# Patient Record
Sex: Male | Born: 1945 | Race: Black or African American | Hispanic: No | Marital: Married | Smoking: Never smoker
Health system: Southern US, Community
[De-identification: ages and names within clinical notes are randomized; demographics above are authoritative.]

## PROBLEM LIST (undated history)

## (undated) DIAGNOSIS — I2510 Atherosclerotic heart disease of native coronary artery without angina pectoris: Secondary | ICD-10-CM

## (undated) DIAGNOSIS — G4733 Obstructive sleep apnea (adult) (pediatric): Secondary | ICD-10-CM

## (undated) DIAGNOSIS — I43 Cardiomyopathy in diseases classified elsewhere: Secondary | ICD-10-CM

## (undated) DIAGNOSIS — I10 Essential (primary) hypertension: Secondary | ICD-10-CM

## (undated) DIAGNOSIS — I4892 Unspecified atrial flutter: Secondary | ICD-10-CM

## (undated) DIAGNOSIS — I340 Nonrheumatic mitral (valve) insufficiency: Secondary | ICD-10-CM

## (undated) DIAGNOSIS — I4891 Unspecified atrial fibrillation: Secondary | ICD-10-CM

## (undated) DIAGNOSIS — R000 Tachycardia, unspecified: Secondary | ICD-10-CM

## (undated) DIAGNOSIS — E119 Type 2 diabetes mellitus without complications: Secondary | ICD-10-CM

## (undated) DIAGNOSIS — G510 Bell's palsy: Secondary | ICD-10-CM

## (undated) HISTORY — DX: Nonrheumatic mitral (valve) insufficiency: I34.0

## (undated) HISTORY — DX: Unspecified atrial flutter: I48.92

## (undated) HISTORY — DX: Type 2 diabetes mellitus without complications: E11.9

## (undated) HISTORY — DX: Bell's palsy: G51.0

## (undated) HISTORY — DX: Atherosclerotic heart disease of native coronary artery without angina pectoris: I25.10

## (undated) HISTORY — DX: Essential (primary) hypertension: I10

## (undated) HISTORY — DX: Obstructive sleep apnea (adult) (pediatric): G47.33

## (undated) HISTORY — DX: Cardiomyopathy in diseases classified elsewhere: I43

## (undated) HISTORY — DX: Tachycardia, unspecified: R00.0

## (undated) HISTORY — DX: Unspecified atrial fibrillation: I48.91

---

## 2003-04-02 ENCOUNTER — Encounter: Payer: Self-pay | Admitting: *Deleted

## 2003-04-02 ENCOUNTER — Inpatient Hospital Stay (HOSPITAL_COMMUNITY): Admission: AD | Admit: 2003-04-02 | Discharge: 2003-04-11 | Payer: Self-pay | Admitting: Cardiology

## 2003-06-19 ENCOUNTER — Ambulatory Visit (HOSPITAL_COMMUNITY): Admission: RE | Admit: 2003-06-19 | Discharge: 2003-06-20 | Payer: Self-pay | Admitting: Internal Medicine

## 2005-06-28 ENCOUNTER — Ambulatory Visit: Payer: Self-pay | Admitting: Cardiology

## 2005-07-01 ENCOUNTER — Ambulatory Visit: Payer: Self-pay | Admitting: Cardiology

## 2005-07-04 ENCOUNTER — Ambulatory Visit: Payer: Self-pay | Admitting: Cardiology

## 2005-07-08 ENCOUNTER — Ambulatory Visit: Payer: Self-pay | Admitting: Cardiology

## 2005-07-08 ENCOUNTER — Inpatient Hospital Stay (HOSPITAL_BASED_OUTPATIENT_CLINIC_OR_DEPARTMENT_OTHER): Admission: RE | Admit: 2005-07-08 | Discharge: 2005-07-08 | Payer: Self-pay | Admitting: Cardiology

## 2005-07-12 ENCOUNTER — Ambulatory Visit: Payer: Self-pay | Admitting: Cardiology

## 2006-06-29 ENCOUNTER — Encounter: Payer: Self-pay | Admitting: Cardiology

## 2007-07-12 ENCOUNTER — Ambulatory Visit: Payer: Self-pay | Admitting: Cardiology

## 2007-08-27 ENCOUNTER — Ambulatory Visit: Payer: Self-pay | Admitting: Cardiology

## 2007-08-30 ENCOUNTER — Ambulatory Visit: Payer: Self-pay | Admitting: Cardiology

## 2007-09-11 ENCOUNTER — Encounter: Payer: Self-pay | Admitting: Cardiology

## 2007-10-16 ENCOUNTER — Ambulatory Visit: Payer: Self-pay | Admitting: Cardiology

## 2007-10-24 ENCOUNTER — Ambulatory Visit: Payer: Self-pay | Admitting: Cardiology

## 2007-11-12 ENCOUNTER — Encounter: Payer: Self-pay | Admitting: Physician Assistant

## 2007-12-21 ENCOUNTER — Ambulatory Visit: Payer: Self-pay | Admitting: Cardiology

## 2007-12-28 ENCOUNTER — Ambulatory Visit: Payer: Self-pay | Admitting: Cardiology

## 2008-01-04 ENCOUNTER — Ambulatory Visit: Payer: Self-pay | Admitting: Cardiology

## 2008-01-11 ENCOUNTER — Ambulatory Visit: Payer: Self-pay | Admitting: Cardiology

## 2008-01-17 ENCOUNTER — Ambulatory Visit: Payer: Self-pay | Admitting: Cardiology

## 2008-02-01 ENCOUNTER — Ambulatory Visit: Payer: Self-pay | Admitting: Cardiology

## 2008-02-11 ENCOUNTER — Ambulatory Visit: Payer: Self-pay | Admitting: Cardiology

## 2008-02-29 ENCOUNTER — Ambulatory Visit: Payer: Self-pay | Admitting: Cardiology

## 2008-03-25 ENCOUNTER — Ambulatory Visit: Payer: Self-pay | Admitting: Cardiology

## 2008-04-04 ENCOUNTER — Ambulatory Visit: Payer: Self-pay | Admitting: Cardiology

## 2008-04-18 ENCOUNTER — Ambulatory Visit: Payer: Self-pay | Admitting: Cardiology

## 2008-04-23 ENCOUNTER — Encounter: Payer: Self-pay | Admitting: Physician Assistant

## 2008-04-23 ENCOUNTER — Ambulatory Visit: Payer: Self-pay | Admitting: Cardiology

## 2008-05-19 ENCOUNTER — Ambulatory Visit: Payer: Self-pay | Admitting: Cardiology

## 2008-06-02 ENCOUNTER — Ambulatory Visit: Payer: Self-pay | Admitting: Cardiology

## 2008-06-04 ENCOUNTER — Ambulatory Visit: Payer: Self-pay | Admitting: Cardiology

## 2008-06-27 ENCOUNTER — Encounter: Payer: Self-pay | Admitting: Cardiology

## 2008-07-03 ENCOUNTER — Ambulatory Visit: Payer: Self-pay | Admitting: Cardiology

## 2008-07-29 ENCOUNTER — Ambulatory Visit: Payer: Self-pay | Admitting: Cardiology

## 2008-08-04 ENCOUNTER — Ambulatory Visit: Payer: Self-pay | Admitting: Cardiology

## 2008-08-19 ENCOUNTER — Ambulatory Visit: Payer: Self-pay | Admitting: Cardiology

## 2008-09-30 ENCOUNTER — Ambulatory Visit: Payer: Self-pay | Admitting: Cardiology

## 2008-10-10 ENCOUNTER — Ambulatory Visit: Payer: Self-pay | Admitting: Cardiology

## 2008-10-24 ENCOUNTER — Ambulatory Visit: Payer: Self-pay | Admitting: Cardiology

## 2008-11-14 ENCOUNTER — Ambulatory Visit: Payer: Self-pay | Admitting: Cardiology

## 2009-01-27 ENCOUNTER — Ambulatory Visit: Payer: Self-pay | Admitting: Cardiology

## 2009-02-10 ENCOUNTER — Ambulatory Visit: Payer: Self-pay | Admitting: Cardiology

## 2009-03-17 ENCOUNTER — Ambulatory Visit: Payer: Self-pay | Admitting: Cardiology

## 2009-04-07 ENCOUNTER — Encounter: Payer: Self-pay | Admitting: Cardiology

## 2009-04-07 ENCOUNTER — Ambulatory Visit: Payer: Self-pay | Admitting: Cardiology

## 2009-04-20 ENCOUNTER — Encounter: Payer: Self-pay | Admitting: Cardiology

## 2009-05-22 ENCOUNTER — Ambulatory Visit: Payer: Self-pay | Admitting: Cardiology

## 2009-07-17 ENCOUNTER — Ambulatory Visit: Payer: Self-pay | Admitting: Cardiology

## 2009-07-17 LAB — CONVERTED CEMR LAB
POC INR: 2
Prothrombin Time: 17.6 s

## 2009-07-20 ENCOUNTER — Encounter: Payer: Self-pay | Admitting: *Deleted

## 2009-08-14 ENCOUNTER — Ambulatory Visit: Payer: Self-pay | Admitting: Cardiology

## 2009-08-14 LAB — CONVERTED CEMR LAB: POC INR: 1.7

## 2009-08-17 ENCOUNTER — Telehealth: Payer: Self-pay | Admitting: Cardiology

## 2009-09-11 ENCOUNTER — Encounter: Payer: Self-pay | Admitting: Cardiology

## 2009-09-20 DIAGNOSIS — I2510 Atherosclerotic heart disease of native coronary artery without angina pectoris: Secondary | ICD-10-CM | POA: Insufficient documentation

## 2009-09-20 DIAGNOSIS — I4891 Unspecified atrial fibrillation: Secondary | ICD-10-CM

## 2009-09-24 ENCOUNTER — Encounter (INDEPENDENT_AMBULATORY_CARE_PROVIDER_SITE_OTHER): Payer: Self-pay | Admitting: Cardiology

## 2009-10-22 ENCOUNTER — Encounter (INDEPENDENT_AMBULATORY_CARE_PROVIDER_SITE_OTHER): Payer: Self-pay | Admitting: Cardiology

## 2009-11-06 ENCOUNTER — Ambulatory Visit: Payer: Self-pay | Admitting: Cardiology

## 2009-12-23 ENCOUNTER — Encounter (INDEPENDENT_AMBULATORY_CARE_PROVIDER_SITE_OTHER): Payer: Self-pay | Admitting: Cardiology

## 2010-01-06 ENCOUNTER — Encounter (INDEPENDENT_AMBULATORY_CARE_PROVIDER_SITE_OTHER): Payer: Self-pay | Admitting: Cardiology

## 2010-01-26 ENCOUNTER — Ambulatory Visit: Payer: Self-pay | Admitting: Cardiology

## 2010-01-26 LAB — CONVERTED CEMR LAB: POC INR: 1.7

## 2010-02-03 ENCOUNTER — Ambulatory Visit: Payer: Self-pay | Admitting: Cardiology

## 2010-02-19 ENCOUNTER — Ambulatory Visit: Payer: Self-pay | Admitting: Cardiology

## 2010-03-09 ENCOUNTER — Ambulatory Visit: Payer: Self-pay | Admitting: Cardiology

## 2010-03-16 ENCOUNTER — Telehealth (INDEPENDENT_AMBULATORY_CARE_PROVIDER_SITE_OTHER): Payer: Self-pay | Admitting: *Deleted

## 2010-03-30 ENCOUNTER — Ambulatory Visit: Payer: Self-pay | Admitting: Cardiology

## 2010-05-04 ENCOUNTER — Ambulatory Visit: Payer: Self-pay | Admitting: Cardiology

## 2010-05-04 LAB — CONVERTED CEMR LAB: POC INR: 2.5

## 2010-06-25 ENCOUNTER — Ambulatory Visit: Payer: Self-pay | Admitting: Cardiology

## 2010-06-25 LAB — CONVERTED CEMR LAB: POC INR: 2.7

## 2010-07-23 ENCOUNTER — Ambulatory Visit: Payer: Self-pay | Admitting: Cardiology

## 2010-08-31 ENCOUNTER — Ambulatory Visit: Payer: Self-pay | Admitting: Cardiology

## 2010-08-31 LAB — CONVERTED CEMR LAB: POC INR: 1.7

## 2010-09-28 ENCOUNTER — Ambulatory Visit: Payer: Self-pay | Admitting: Cardiology

## 2010-09-28 LAB — CONVERTED CEMR LAB: POC INR: 1.3

## 2010-10-05 ENCOUNTER — Ambulatory Visit: Payer: Self-pay | Admitting: Cardiology

## 2010-10-05 LAB — CONVERTED CEMR LAB: POC INR: 3.4

## 2010-10-19 ENCOUNTER — Ambulatory Visit: Payer: Self-pay | Admitting: Cardiology

## 2010-11-12 ENCOUNTER — Ambulatory Visit: Payer: Self-pay | Admitting: Cardiology

## 2010-11-12 LAB — CONVERTED CEMR LAB: POC INR: 4.8

## 2010-11-23 ENCOUNTER — Ambulatory Visit: Payer: Self-pay | Admitting: Cardiology

## 2010-12-07 ENCOUNTER — Ambulatory Visit: Admit: 2010-12-07 | Payer: Self-pay

## 2010-12-24 ENCOUNTER — Ambulatory Visit: Admission: RE | Admit: 2010-12-24 | Discharge: 2010-12-24 | Payer: Self-pay | Source: Home / Self Care

## 2010-12-24 LAB — CONVERTED CEMR LAB: POC INR: 2.6

## 2011-01-04 NOTE — Letter (Signed)
Summary: Custom - Delinquent Coumadin 2  [facility]  [address]   [place], [place] [address]   Phone: [phone]  Fax: [phone]     January 06, 2010 MRN: [id number]   [name] [address] [place], [place]  [address]   Dear Mr. ISER,  We have attempted to contact you by phone and letter on multiple occasions to contact our office for important blood work associated with the blood thinner, warfarin (Coumadin).  Warfarin is a very important drug that can cause life threatening side effects including, bleeding, and thus requires close laboratory monitoring.  We are unable to accept responsibility for blood thinner-related health problems you may develop because you have not followed our recommendations for appropriate monitoring.  These may include abnormal bleeding occurrences and/or development of blood clots (stroke, heart attack, blood clots in legs or lungs, etc.).  We need for you to contact this office at the number listed above to schedule and complete this very important blood work.  Thank you for your assistance in this urgent matter.  Sincerely, Vashti Hey RN [facility] Cardi[facility] Reduction [facility] Team

## 2011-01-04 NOTE — Medication Information (Signed)
Summary: CCR  Anticoagulant Therapy  Managed by: Vashti Hey, RN PCP: Dr. Meredith Mody Supervising MD: Andee Lineman MD, Michelle Piper Indication 1: Atrial Fibrillation (ICD-427.31) [facility] [place] Site: [place] INR POC 2.7  Dietary changes: no    Health status changes: no    Bleeding/hemorrhagic complications: no    Recent/future hospitalizations: no    Any changes in medication regimen? no    Recent/future dental: no  Any missed doses?: no       Is patient compliant with meds? yes       Allergies: No Known Drug Allergies  Anticoagulation Management History:      The patient is taking warfarin and comes in today for a routine follow up visit.  Negative risk factors for bleeding include an age less than 30 years old.  The bleeding index is 'low risk'.  Negative CHADS2 values include Age > 59 years old.  The start date was 12/26/2007.  Anticoagulation responsible provider: Andee Lineman MD, Michelle Piper.  INR POC: 2.7.  Cuvette Lot#: [id number].  Exp: 10/11.    Anticoagulation Management Assessment/Plan:      The patient's current anticoagulation dose is Warfarin sodium 5 mg tabs: Take 1 tablet by mouth once a day.  The target INR is 2.0-3.0.  The next INR is due 07/23/2010.  Anticoagulation instructions were given to patient.  Results were reviewed/authorized by Vashti Hey, RN.  He was notified by Vashti Hey RN.         Prior Anticoagulation Instructions: INR 2.5 Continue coumadin 10mg  once daily except 15mg  on Mondays and Fridays  Current Anticoagulation Instructions: INR 2.7 Continue coumadin 10mg  once daily except 15mg  on Mondays and Fridays

## 2011-01-04 NOTE — Medication Information (Signed)
Summary: CCR-  Anticoagulant Therapy  Managed by: Vashti Hey, RN PCP: Jule Ser MD: Andee Lineman MD, Michelle Piper Indication 1: Atrial Fibrillation (ICD-427.31) Lab [facility] [place] Site: [place] INR POC 1.7  Dietary changes: no    Health status changes: no    Bleeding/hemorrhagic complications: no    Recent/future hospitalizations: no    Any changes in medication regimen? no    Recent/future dental: no  Any missed doses?: no       Is patient compliant with meds? yes       Allergies: No Known Drug Allergies  Anticoagulation Management History:      The patient is taking warfarin and comes in today for a routine follow up visit.  Negative risk factors for bleeding include an age less than 55 years old.  The bleeding index is 'low risk'.  Negative CHADS2 values include Age > 8 years old.  The start date was 12/26/2007.  Anticoagulation responsible provider: Andee Lineman MD, Michelle Piper.  INR POC: 1.7.  Cuvette Lot#: [id number].  Exp: 10/11.    Anticoagulation Management Assessment/Plan:      The patient's current anticoagulation dose is Warfarin sodium 5 mg tabs: Take 1 tablet by mouth once a day.  The target INR is 2.0-3.0.  The next INR is due 02/12/2010.  Anticoagulation instructions were given to patient.  Results were reviewed/authorized by Vashti Hey, RN.  He was notified by Vashti Hey RN.         Prior Anticoagulation Instructions: INR 2.4 Continue coumadin 10mg  once daily except 15mg  on Mondays  Current Anticoagulation Instructions: INR 1.7 Take coumadin 4 tablets tonight, 3 tablets tomorrow night then resume 2 tablets once daily except 3 tablets on Mondays

## 2011-01-04 NOTE — Progress Notes (Signed)
Summary: NEED CLARIFICATION ON WARFARIN RX  Phone Note From Pharmacy Call back at [phone]   Caller: [facility]* Call For: nurse  Summary of Call: message left on machine from pharmacist calling to get clarification on warfarin rx that was sent on 03/09/10.  Initial call taken by: Carlye Grippe,  March 16, 2010 1:50 PM  Follow-up for Phone Call        Called pharmacist and verified correct dosage. Follow-up by: Vashti Hey RN,  March 16, 2010 2:11 PM

## 2011-01-04 NOTE — Assessment & Plan Note (Signed)
Summary: 6 month fu recv reminder, vs   Visit Type:  Follow-up Primary Provider:  Dr. Meredith Mody   History of Present Illness: 65 year old male presents for a followup visit. He reports no significant palpitations or chest pain. He has had some problems with lower back pain recently, treated by Dr. Willaim Bane with limited courses of nonsteroidal anti-inflammatory drugs.  [name] states that he forgot to take his medications this morning, reflected his elevated blood pressure.  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  He reports no bleeding problems on Coumadin.  Current Medications (verified): 1)  Aspirin 81 Mg Tabs (Aspirin) .... Take 1 Tablet By Mouth Once A Day 2)  Hydrochlorothiazide 25 Mg Tabs (Hydrochlorothiazide) 3)  Januvia 100 Mg Tabs (Sitagliptin Phosphate) .... Take 1 Tablet By Mouth Once A Day 4)  Lasix 20 Mg Tabs (Furosemide) 5)  Lisinopril 10 Mg Tabs (Lisinopril) .... Take 1 Tablet By Mouth Once A Day 6)  Mag-Oxide 400 Mg Tabs (Magnesium Oxide) .... Take 1 Tablet By Mouth Twice A Day 7)  Potassium Chloride Cr 10 Meq Cr-Caps (Potassium Chloride) 8)  Toprol Xl 50 Mg Xr24h-Tab (Metoprolol Succinate) .... Take 1 1/2 Tablets By Mouth Daily. 9)  Warfarin Sodium 5 Mg Tabs (Warfarin Sodium) .... Take 1 Tablet By Mouth Once A Day  Allergies (verified): No Known Drug Allergies  Comments:  Nurse/Medical Assistant: The patient is currently on medications but does not know the name or dosage at this time. Instructed to contact our office with details. Will update medication list at that time.  Past History:  Past Medical History: Last updated: 02/02/2010 Atrial Fibrillation CAD - nonobstructive, LVEF normal Atrial Flutter - RFA 2004 Tachycardia induced cardiomyopathy - resolved OSA Diabetes Type 2 Hypertension Bell's palsy  Social History: Last updated: 02/02/2010 Full Time Married Tobacco Use - Former Alcohol  Use - no  Clinical Review Panels:  Stress Echocardiogram Stress Echocardiogram Conclusions:         1. The electrocardiographic response is not interpretable.         2. This was a negative echocardiographic stress test.         3. Echocardiographic findings are consistent with normal left         ventricular function. (06/04/2008)    Review of Systems  The patient denies anorexia, fever, weight loss, chest pain, syncope, dyspnea on exertion, peripheral edema, melena, and hematochezia.         Otherwise reviewed and negative except as outlined above.  Vital Signs:  Patient profile:   65 year old male Height:      70 inches Weight:      282 pounds BMI:     40.61 Pulse rate:   99 / minute BP sitting:   151 / 103  (left arm) Cuff size:   large  Vitals Entered By: Carlye Grippe (February 03, 2010 3:38 PM)  Nutrition Counseling: Patient's BMI is greater than 25 and therefore counseled on weight management options.  Serial Vital Signs/Assessments:  Time      Position  BP       Pulse  Resp  Temp     By 3:40 PM             145/90   105                   Carlye Grippe   Physical Exam  Additional Exam:  Obese male in  no acute distress. HEENT: Conjunctiva and lids normal, oropharynx clear. Neck: Supple, no elevated jugular venous pressure or bruits. Lungs: Clear to auscultation, and nonlabored. Cardiac: Irregularly irregular, no S3. Abdomen: Soft, nontender, bowel sounds present. Skin: Warm and dry. Extremities: No pitting edema.   EKG  Procedure date:  02/03/2010  Findings:      Atrial fibrillation at 87 beats per minute with nonspecific ST-T wave changes.  Impression & Recommendations:  Problem # 1:  ATRIAL FIBRILLATION (ICD-427.31)  Chronic atrial fibrillation, rate controlled, and on Coumadin for stroke prophylaxis. No progressive symptomatology as described.  His updated medication list for this problem includes:    Aspirin 81 Mg Tabs (Aspirin) .Marland Kitchen... Take 1  tablet by mouth once a day    Toprol Xl 50 Mg Xr24h-tab (Metoprolol succinate) .Marland Kitchen... Take 1 1/2 tablets by mouth daily.    Warfarin Sodium 5 Mg Tabs (Warfarin sodium) .Marland Kitchen... Take 1 tablet by mouth once a day  Orders: EKG w/ Interpretation ([address])  Problem # 2:  CAD, ARTERY BYPASS GRAFT (ICD-414.04)  Previously documented nonobstructive coronary artery disease with normal ejection fraction. Ischemic testing in July 2009 was reassuring. He is not reporting any angina.  His updated medication list for this problem includes:    Aspirin 81 Mg Tabs (Aspirin) .Marland Kitchen... Take 1 tablet by mouth once a day    Lisinopril 10 Mg Tabs (Lisinopril) .Marland Kitchen... Take 1 tablet by mouth once a day    Toprol Xl 50 Mg Xr24h-tab (Metoprolol succinate) .Marland Kitchen... Take 1 1/2 tablets by mouth daily.    Warfarin Sodium 5 Mg Tabs (Warfarin sodium) .Marland Kitchen... Take 1 tablet by mouth once a day  Orders: EKG w/ Interpretation ([address])  Patient Instructions: 1)  Your physician wants you to follow-up in: 6 months. You will receive a reminder letter in the mail one-two months in advance. If you don't receive a letter, please call our office to schedule the follow-up appointment. 2)  Your physician recommends that you continue on your current medications as directed. Please refer to the Current Medication list given to you today.

## 2011-01-04 NOTE — Letter (Signed)
Summary: Custom - Delinquent Coumadin 1  [facility]  [address]   [place], [place] [address]   Phone: [phone]  Fax: [phone]     December 23, 2009 MRN: [id number]   [name] [address] [place], [place]  [address]   Dear [name],  This letter is being sent to you as a reminder that it is necessary for you to get your INR/PT checked regularly so that we can optimize your care.  Our records indicate that you were scheduled to have a test done recently.  As of today, we have not received the results of this test.  It is very important that you have your INR checked.  Please call our office at the number listed above to schedule an appointment at your earliest convenience.    If you have recently had your protime checked or have discontinued this medication, please contact our office at the above phone number to clarify this issue.  Thank you for this prompt attention to this important health care matter.  Sincerely, Vashti Hey  [facility] Cardiovascular [facility] Reduction [facility] Team

## 2011-01-04 NOTE — Medication Information (Signed)
Summary: ccr-lr  Anticoagulant Therapy  Managed by: Vashti Hey, RN PCP: Dr. Meredith Mody Supervising MD: Andee Lineman MD, Michelle Piper Indication 1: Atrial Fibrillation (ICD-427.31) Lab [facility] Site: [facility] INR POC 1.7  Dietary changes: no    Health status changes: no    Bleeding/hemorrhagic complications: no    Recent/future hospitalizations: no    Any changes in medication regimen? no    Recent/future dental: no  Any missed doses?: no       Is patient compliant with meds? yes       Allergies: No Known Drug Allergies  Anticoagulation Management History:      The patient is taking warfarin and comes in today for a routine follow up visit.  Negative risk factors for bleeding include an age less than 20 years old.  The bleeding index is 'low risk'.  Negative CHADS2 values include Age > 50 years old.  The start date was 12/26/2007.  Anticoagulation responsible provider: Andee Lineman MD, Michelle Piper.  INR POC: 1.7.  Cuvette Lot#: [id number].  Exp: 10/11.    Anticoagulation Management Assessment/Plan:      The patient's current anticoagulation dose is Warfarin sodium 5 mg tabs: Take 1 tablet by mouth once a day.  The target INR is 2.0-3.0.  The next INR is due 09/21/2010.  Anticoagulation instructions were given to patient.  Results were reviewed/authorized by Vashti Hey, RN.  He was notified by Vashti Hey RN.         Prior Anticoagulation Instructions: INR 2.3 Continue coumadin 10mg  once daily except 15mg  on Mondays and Fridays  Current Anticoagulation Instructions: INR 1.7  Take coumadin 15mg  x 3 then resume 10mg  once daily except 15mg  on Mondays and Fridays

## 2011-01-04 NOTE — Medication Information (Signed)
Summary: ccr-lr  Anticoagulant Therapy  Managed by: Vashti Hey, RN PCP: Dr. Meredith Mody Supervising MD: Myrtis Ser MD, Tinnie Gens Indication 1: Atrial Fibrillation (ICD-427.31) Lab [facility] [place] Site: [place] INR POC 1.6  Dietary changes: no    Health status changes: no    Bleeding/hemorrhagic complications: no    Recent/future hospitalizations: no    Any changes in medication regimen? yes       Details: has tramadol and arthrotec to take for back pain if he needs it  Recent/future dental: no  Any missed doses?: yes     Details: missed 1 dose this week  Is patient compliant with meds? yes       Allergies: No Known Drug Allergies  Anticoagulation Management History:      The patient is taking warfarin and comes in today for a routine follow up visit.  Negative risk factors for bleeding include an age less than 67 years old.  The bleeding index is 'low risk'.  Negative CHADS2 values include Age > 57 years old.  The start date was 12/26/2007.  Anticoagulation responsible provider: Myrtis Ser MD, Tinnie Gens.  INR POC: 1.6.  Cuvette Lot#: [id number].  Exp: 10/11.    Anticoagulation Management Assessment/Plan:      The patient's current anticoagulation dose is Warfarin sodium 5 mg tabs: Take 1 tablet by mouth once a day.  The target INR is 2.0-3.0.  The next INR is due 03/05/2010.  Anticoagulation instructions were given to patient.  Results were reviewed/authorized by Vashti Hey, RN.  He was notified by Vashti Hey RN.         Prior Anticoagulation Instructions: INR 1.7 Take coumadin 4 tablets tonight, 3 tablets tomorrow night then resume 2 tablets once daily except 3 tablets on Mondays  Current Anticoagulation Instructions: INR 1.6 Take coumadin 15 mg x 2  then increase coumadin to 10mg  once daily except 15mg  on Mondays and Fridays

## 2011-01-04 NOTE — Medication Information (Signed)
Summary: ccr-lr  Anticoagulant Therapy  Managed by: Edgar Hey, RN PCP: Dr. Meredith Mody Supervising MD: Andee Lineman MD, Michelle Piper Indication 1: Atrial Fibrillation (ICD-427.31) Lab [facility] [place] Site: [place] INR POC 2.3  Dietary changes: no    Health status changes: no    Bleeding/hemorrhagic complications: no    Recent/future hospitalizations: no    Any changes in medication regimen? no    Recent/future dental: no  Any missed doses?: no       Is patient compliant with meds? yes       Allergies: No Known Drug Allergies  Anticoagulation Management History:      The patient is taking warfarin and comes in today for a routine follow up visit.  Negative risk factors for bleeding include an age less than 66 years old.  The bleeding index is 'low risk'.  Negative CHADS2 values include Age > 66 years old.  The start date was 12/26/2007.  Anticoagulation responsible Tayanna Talford: Andee Lineman MD, Michelle Piper.  INR POC: 2.3.  Cuvette Lot#: [id number].  Exp: 10/11.    Anticoagulation Management Assessment/Plan:      The patient's current anticoagulation dose is Warfarin sodium 5 mg tabs: Take 1 tablet by mouth once a day.  The target INR is 2.0-3.0.  The next INR is due 08/20/2010.  Anticoagulation instructions were given to patient.  Results were reviewed/authorized by Edgar Hey, RN.  He was notified by Edgar Hey RN.         Prior Anticoagulation Instructions: INR 2.7 Continue coumadin 10mg  once daily except 15mg  on Mondays and Fridays  Current Anticoagulation Instructions: INR 2.3 Continue coumadin 10mg  once daily except 15mg  on Mondays and Fridays

## 2011-01-04 NOTE — Medication Information (Signed)
Summary: ccr-pt rescheduled-lr  Anticoagulant Therapy  Managed by: Vashti Hey, RN PCP: Dr. Meredith Mody Supervising MD: Antoine Poche MD, Fayrene Fearing Indication 1: Atrial Fibrillation (ICD-427.31) Lab [facility] [place] Site: [place] INR POC 2.5  Dietary changes: no    Health status changes: no    Bleeding/hemorrhagic complications: no    Recent/future hospitalizations: no    Any changes in medication regimen? no    Recent/future dental: no  Any missed doses?: no       Is patient compliant with meds? yes       Allergies: No Known Drug Allergies  Anticoagulation Management History:      The patient is taking warfarin and comes in today for a routine follow up visit.  Negative risk factors for bleeding include an age less than 66 years old.  The bleeding index is 'low risk'.  Negative CHADS2 values include Age > 66 years old.  The start date was 12/26/2007.  Anticoagulation responsible provider: Antoine Poche MD, Fayrene Fearing.  INR POC: 2.5.  Cuvette Lot#: [id number].  Exp: 10/11.    Anticoagulation Management Assessment/Plan:      The patient's current anticoagulation dose is Warfarin sodium 5 mg tabs: Take 1 tablet by mouth once a day.  The target INR is 2.0-3.0.  The next INR is due 06/01/2010.  Anticoagulation instructions were given to patient.  Results were reviewed/authorized by Vashti Hey, RN.  He was notified by Vashti Hey RN.         Prior Anticoagulation Instructions: INR 2.1 Continue coumadin 10mg  once daily except 15mg  on Mondays and Fridays  Current Anticoagulation Instructions: INR 2.5 Continue coumadin 10mg  once daily except 15mg  on Mondays and Fridays

## 2011-01-04 NOTE — Medication Information (Signed)
Summary: ccr-lr  Anticoagulant Therapy  Managed by: Vashti Hey, RN PCP: Dr. Meredith Mody Supervising MD: Myrtis Ser MD, Tinnie Gens Indication 1: Atrial Fibrillation (ICD-427.31) Lab [facility] Site: [place] INR POC 4.8  Dietary changes: no    Health status changes: no    Bleeding/hemorrhagic complications: no    Recent/future hospitalizations: no    Any changes in medication regimen? no    Recent/future dental: no  Any missed doses?: no       Is patient compliant with meds? yes       Allergies: No Known Drug Allergies  Anticoagulation Management History:      The patient is taking warfarin and comes in today for a routine follow up visit.  Positive risk factors for bleeding include an age of 66 years or older.  The bleeding index is 'intermediate risk'.  Negative CHADS2 values include Age > 72 years old.  The start date was 12/26/2007.  Anticoagulation responsible provider: Myrtis Ser MD, Tinnie Gens.  INR POC: 4.8.  Cuvette Lot#: [id number].  Exp: 10/11.    Anticoagulation Management Assessment/Plan:      The patient's current anticoagulation dose is Warfarin sodium 5 mg tabs: Take 1 tablet by mouth once a day.  The target INR is 2.0-3.0.  The next INR is due 11/23/2010.  Anticoagulation instructions were given to patient.  Results were reviewed/authorized by Vashti Hey, RN.  He was notified by Vashti Hey RN.         Prior Anticoagulation Instructions: INR 1.4 Missed 2 doses last week Take coumadin 4 tablets tonight,  3 1/2 tablets tomorrow night then resume 3 tablets once daily except 2 tablets on S,T,Th  Current Anticoagulation Instructions: INR 4.8 Hold coumadin tonight and tomorrow night then resume 15mg  once daily except 10mg  on S,T,Th

## 2011-01-04 NOTE — Medication Information (Signed)
Summary: ccr-lr  Anticoagulant Therapy  Managed by: Vashti Hey, RN PCP: Dr. Meredith Mody Supervising MD: Diona Browner MD, Remi Deter Indication 1: Atrial Fibrillation (ICD-427.31) Lab [facility] [place] Site: [place] INR POC 3.4  Dietary changes: no    Health status changes: no    Bleeding/hemorrhagic complications: no    Recent/future hospitalizations: no    Any changes in medication regimen? no    Recent/future dental: no  Any missed doses?: no       Is patient compliant with meds? yes       Allergies: No Known Drug Allergies  Anticoagulation Management History:      The patient is taking warfarin and comes in today for a routine follow up visit.  Positive risk factors for bleeding include an age of 66 years or older.  The bleeding index is 'intermediate risk'.  Negative CHADS2 values include Age > 95 years old.  The start date was 12/26/2007.  Anticoagulation responsible provider: Diona Browner MD, Remi Deter.  INR POC: 3.4.  Cuvette Lot#: [id number].  Exp: 10/11.    Anticoagulation Management Assessment/Plan:      The patient's current anticoagulation dose is Warfarin sodium 5 mg tabs: Take 1 tablet by mouth once a day.  The target INR is 2.0-3.0.  The next INR is due 10/26/2010.  Anticoagulation instructions were given to patient.  Results were reviewed/authorized by Vashti Hey, RN.  He was notified by Vashti Hey RN.         Prior Anticoagulation Instructions: INR 1.3 Denies missing doses or med changes Increase coumadin to 15mg  once daily except 10mg  on Sundays and recheck INR 10/05/10.  Current Anticoagulation Instructions: INR 3.4 Decrease coumadin to 15mg  once daily except 10mg  on Sunday, Tuesdays and Thursdays

## 2011-01-04 NOTE — Medication Information (Signed)
Summary: ccr-lr  Anticoagulant Therapy  Managed by: Vashti Hey, RN PCP: Dr. Meredith Mody Supervising MD: Diona Browner MD, Remi Deter Indication 1: Atrial Fibrillation (ICD-427.31) Lab [facility] [place] Site: [place] INR POC 1.3  Dietary changes: no    Health status changes: no    Bleeding/hemorrhagic complications: no    Recent/future hospitalizations: no    Any changes in medication regimen? no    Recent/future dental: no  Any missed doses?: no       Is patient compliant with meds? yes       Allergies: No Known Drug Allergies  Anticoagulation Management History:      The patient is taking warfarin and comes in today for a routine follow up visit.  Positive risk factors for bleeding include an age of 66 years or older.  The bleeding index is 'intermediate risk'.  Negative CHADS2 values include Age > 31 years old.  The start date was 12/26/2007.  Anticoagulation responsible provider: Diona Browner MD, Remi Deter.  INR POC: 1.3.  Cuvette Lot#: [id number].  Exp: 10/11.    Anticoagulation Management Assessment/Plan:      The patient's current anticoagulation dose is Warfarin sodium 5 mg tabs: Take 1 tablet by mouth once a day.  The target INR is 2.0-3.0.  The next INR is due 10/05/2010.  Anticoagulation instructions were given to patient.  Results were reviewed/authorized by Vashti Hey, RN.  He was notified by Vashti Hey RN.         Prior Anticoagulation Instructions: INR 1.7  Take coumadin 15mg  x 3 then resume 10mg  once daily except 15mg  on Mondays and Fridays  Current Anticoagulation Instructions: INR 1.3 Denies missing doses or med changes Increase coumadin to 15mg  once daily except 10mg  on Sundays and recheck INR 10/05/10.

## 2011-01-04 NOTE — Procedures (Signed)
Summary: Holter and Event/ [facility] END OF SERVICE SUMMARY REPORT  Holter and Event/ [facility] END OF SERVICE SUMMARY REPORT   Imported By: Dorise Hiss 02/02/2010 09:49:02  _____________________________________________________________________  External Attachment:    Type:   Image     Comment:   External Document

## 2011-01-04 NOTE — Medication Information (Signed)
Summary: ccr-lr  Anticoagulant Therapy  Managed by: Vashti Hey, RN PCP: Dr. Meredith Mody Supervising MD: Antoine Poche MD, Fayrene Fearing Indication 1: Atrial Fibrillation (ICD-427.31) [facility] Site: [facility] INR POC 2.5  Dietary changes: no    Health status changes: no    Bleeding/hemorrhagic complications: no    Recent/future hospitalizations: no    Any changes in medication regimen? no    Recent/future dental: no  Any missed doses?: no       Is patient compliant with meds? yes       Allergies: No Known Drug Allergies  Anticoagulation Management History:      The patient is taking warfarin and comes in today for a routine follow up visit.  Negative risk factors for bleeding include an age less than 33 years old.  The bleeding index is 'low risk'.  Negative CHADS2 values include Age > 11 years old.  The start date was 12/26/2007.  Anticoagulation responsible provider: Antoine Poche MD, Fayrene Fearing.  INR POC: 2.5.  Cuvette Lot#: [id number].  Exp: 10/11.    Anticoagulation Management Assessment/Plan:      The patient's current anticoagulation dose is Warfarin sodium 5 mg tabs: Take 1 tablet by mouth once a day.  The target INR is 2.0-3.0.  The next INR is due 03/30/2010.  Anticoagulation instructions were given to patient.  Results were reviewed/authorized by Vashti Hey, RN.  He was notified by Vashti Hey RN.         Prior Anticoagulation Instructions: INR 1.6 Take coumadin 15 mg x 2  then increase coumadin to 10mg  once daily except 15mg  on Mondays and Fridays  Current Anticoagulation Instructions: INR 2.5 Continue coumadin 10mg  once daily except 15mg  on Mondays and Fridays Prescriptions: WARFARIN SODIUM 5 MG TABS (WARFARIN SODIUM) Take 1 tablet by mouth once a day  #90 x 2   Entered by:   Vashti Hey RN   Authorized by:   Loreli Slot, MD, [facility]   Signed by:   Vashti Hey RN on 03/09/2010   Method used:   Electronically to        [facility]  (retail)       [address]       [place], [place]  [address]       Ph: [phone]       Fax: [phone]   RxID:   [id number]

## 2011-01-04 NOTE — Medication Information (Signed)
Summary: ccr-lr  Anticoagulant Therapy  Managed by: Vashti Hey, RN PCP: Dr. Meredith Mody Supervising MD: Andee Lineman MD, Michelle Piper Indication 1: Atrial Fibrillation (ICD-427.31) Lab [facility] [place] Site: [facility] INR POC 2.1  Dietary changes: no    Health status changes: no    Bleeding/hemorrhagic complications: no    Recent/future hospitalizations: no    Any changes in medication regimen? no    Recent/future dental: no  Any missed doses?: no       Is patient compliant with meds? yes       Allergies: No Known Drug Allergies  Anticoagulation Management History:      The patient is taking warfarin and comes in today for a routine follow up visit.  Negative risk factors for bleeding include an age less than 14 years old.  The bleeding index is 'low risk'.  Negative CHADS2 values include Age > 30 years old.  The start date was 12/26/2007.  Anticoagulation responsible provider: Andee Lineman MD, Michelle Piper.  INR POC: 2.1.  Cuvette Lot#: [id number].  Exp: 10/11.    Anticoagulation Management Assessment/Plan:      The patient's current anticoagulation dose is Warfarin sodium 5 mg tabs: Take 1 tablet by mouth once a day.  The target INR is 2.0-3.0.  The next INR is due 04/27/2010.  Anticoagulation instructions were given to patient.  Results were reviewed/authorized by Vashti Hey, RN.  He was notified by Vashti Hey RN.         Prior Anticoagulation Instructions: INR 2.5 Continue coumadin 10mg  once daily except 15mg  on Mondays and Fridays  Current Anticoagulation Instructions: INR 2.1 Continue coumadin 10mg  once daily except 15mg  on Mondays and Fridays

## 2011-01-04 NOTE — Medication Information (Signed)
Summary: ccr-lr  Anticoagulant Therapy  Managed by: Vashti Hey, RN PCP: Dr. Meredith Mody Supervising MD: Andee Lineman MD, Michelle Piper Indication 1: Atrial Fibrillation (ICD-427.31) Lab [facility] [place] Site: [facility] INR POC 1.4  Dietary changes: no    Health status changes: no    Bleeding/hemorrhagic complications: no    Recent/future hospitalizations: no    Any changes in medication regimen? no    Recent/future dental: no  Any missed doses?: yes     Details: Missed 1-2 doses   Ran out of med  Is patient compliant with meds? yes       Allergies: No Known Drug Allergies  Anticoagulation Management History:      The patient is taking warfarin and comes in today for a routine follow up visit.  Positive risk factors for bleeding include an age of 66 years or older.  The bleeding index is 'intermediate risk'.  Negative CHADS2 values include Age > 38 years old.  The start date was 12/26/2007.  Anticoagulation responsible Javyn Havlin: Andee Lineman MD, Michelle Piper.  INR POC: 1.4.  Cuvette Lot#: [id number].  Exp: 10/11.    Anticoagulation Management Assessment/Plan:      The patient's current anticoagulation dose is Warfarin sodium 5 mg tabs: Take 1 tablet by mouth once a day.  The target INR is 2.0-3.0.  The next INR is due 11/02/2010.  Anticoagulation instructions were given to patient.  Results were reviewed/authorized by Vashti Hey, RN.  He was notified by Vashti Hey RN.         Prior Anticoagulation Instructions: INR 3.4 Decrease coumadin to 15mg  once daily except 10mg  on Sunday, Tuesdays and Thursdays  Current Anticoagulation Instructions: INR 1.4 Missed 2 doses last week Take coumadin 4 tablets tonight,  3 1/2 tablets tomorrow night then resume 3 tablets once daily except 2 tablets on S,T,Th

## 2011-01-06 NOTE — Medication Information (Signed)
Summary: CCR  Anticoagulant Therapy  Managed by: Vashti Hey, RN PCP: Dr. Meredith Mody Supervising MD: Andee Lineman MD, Michelle Piper Indication 1: Atrial Fibrillation (ICD-427.31) Lab [facility] [place] Site: [facility] INR POC 2.6  Dietary changes: no    Health status changes: no    Bleeding/hemorrhagic complications: no    Recent/future hospitalizations: no    Any changes in medication regimen? no    Recent/future dental: no  Any missed doses?: no       Is patient compliant with meds? yes       Allergies: No Known Drug Allergies  Anticoagulation Management History:      The patient is taking warfarin and comes in today for a routine follow up visit.  Positive risk factors for bleeding include an age of 62 years or older.  The bleeding index is 'intermediate risk'.  Negative CHADS2 values include Age > 50 years old.  The start date was 12/26/2007.  Anticoagulation responsible provider: Andee Lineman MD, Michelle Piper.  INR POC: 2.6.  Cuvette Lot#: [id number].  Exp: 10/11.    Anticoagulation Management Assessment/Plan:      The patient's current anticoagulation dose is Warfarin sodium 5 mg tabs: Take 1 tablet by mouth once a day.  The target INR is 2.0-3.0.  The next INR is due 01/21/2011.  Anticoagulation instructions were given to patient.  Results were reviewed/authorized by Vashti Hey, RN.  He was notified by Vashti Hey RN.         Prior Anticoagulation Instructions: INR 4.8 Hold coumadin tonight and tomorrow night then decrease dose to 10mg  once daily except 15mg  on M,W,F Is on PCN for tooth abcess  Current Anticoagulation Instructions: INR 2.6 Continue coumadin 10mg  once daily except 15mg  on M,W,F

## 2011-01-06 NOTE — Medication Information (Signed)
Summary: ccr-lr  Anticoagulant Therapy  Managed by: Vashti Hey, RN PCP: Dr. Meredith Mody Supervising MD: Diona Browner MD, Remi Deter Indication 1: Atrial Fibrillation (ICD-427.31) Lab [facility] Site: [facility] INR POC 4.8  Dietary changes: no    Health status changes: no    Bleeding/hemorrhagic complications: no    Recent/future hospitalizations: no    Any changes in medication regimen? yes       Details: PCN for abcessed tooth  Recent/future dental: no  Any missed doses?: no       Is patient compliant with meds? yes       Allergies: No Known Drug Allergies  Anticoagulation Management History:      The patient is taking warfarin and comes in today for a routine follow up visit.  Positive risk factors for bleeding include an age of 66 years or older.  The bleeding index is 'intermediate risk'.  Negative CHADS2 values include Age > 63 years old.  The start date was 12/26/2007.  Anticoagulation responsible provider: Diona Browner MD, Remi Deter.  INR POC: 4.8.  Cuvette Lot#: [id number].  Exp: 10/11.    Anticoagulation Management Assessment/Plan:      The patient's current anticoagulation dose is Warfarin sodium 5 mg tabs: Take 1 tablet by mouth once a day.  The target INR is 2.0-3.0.  The next INR is due 11/23/2010.  Anticoagulation instructions were given to patient.  Results were reviewed/authorized by Vashti Hey, RN.  He was notified by Vashti Hey RN.         Prior Anticoagulation Instructions: INR 4.8 Hold coumadin tonight and tomorrow night then resume 15mg  once daily except 10mg  on S,T,Th  Current Anticoagulation Instructions: INR 4.8 Hold coumadin tonight and tomorrow night then decrease dose to 10mg  once daily except 15mg  on M,W,F Is on PCN for tooth abcess

## 2011-01-25 ENCOUNTER — Encounter: Payer: Self-pay | Admitting: Cardiology

## 2011-01-25 ENCOUNTER — Encounter (INDEPENDENT_AMBULATORY_CARE_PROVIDER_SITE_OTHER): Payer: BC Managed Care – PPO

## 2011-01-25 DIAGNOSIS — Z7901 Long term (current) use of anticoagulants: Secondary | ICD-10-CM

## 2011-01-25 DIAGNOSIS — I4891 Unspecified atrial fibrillation: Secondary | ICD-10-CM

## 2011-02-01 NOTE — Medication Information (Signed)
Summary: ccr-lr/ fhh  Anticoagulant Therapy  Managed by: Vashti Hey, RN PCP: Dr. Meredith Mody Supervising MD: Diona Browner MD, Remi Deter Indication 1: Atrial Fibrillation (ICD-427.31) Lab [facility] Site: [facility] INR POC 3.1  Dietary changes: no    Health status changes: no    Bleeding/hemorrhagic complications: no    Recent/future hospitalizations: no    Any changes in medication regimen? no    Recent/future dental: no  Any missed doses?: no       Is patient compliant with meds? yes       Allergies: No Known Drug Allergies  Anticoagulation Management History:      The patient is taking warfarin and comes in today for a routine follow up visit.  Positive risk factors for bleeding include an age of 35 years or older.  The bleeding index is 'intermediate risk'.  Negative CHADS2 values include Age > 61 years old.  The start date was 12/26/2007.  Anticoagulation responsible provider: Diona Browner MD, Remi Deter.  INR POC: 3.1.  Cuvette Lot#: [id number].  Exp: 10/11.    Anticoagulation Management Assessment/Plan:      The patient's current anticoagulation dose is Warfarin sodium 5 mg tabs: Take 1 tablet by mouth once a day.  The target INR is 2.0-3.0.  The next INR is due 02/22/2011.  Anticoagulation instructions were given to patient.  Results were reviewed/authorized by Vashti Hey, RN.  He was notified by Vashti Hey RN.         Prior Anticoagulation Instructions: INR 2.6 Continue coumadin 10mg  once daily except 15mg  on M,W,F  Current Anticoagulation Instructions: INR 3.1 Take coumadin 5mg  tonight then resume 10mg  once daily except 15mg  on M,W,F

## 2011-02-18 ENCOUNTER — Encounter: Payer: Self-pay | Admitting: Cardiology

## 2011-02-18 DIAGNOSIS — Z7901 Long term (current) use of anticoagulants: Secondary | ICD-10-CM | POA: Insufficient documentation

## 2011-02-18 DIAGNOSIS — I4891 Unspecified atrial fibrillation: Secondary | ICD-10-CM

## 2011-02-22 ENCOUNTER — Encounter: Payer: Medicare Other | Admitting: *Deleted

## 2011-03-10 ENCOUNTER — Other Ambulatory Visit: Payer: Self-pay | Admitting: *Deleted

## 2011-03-10 MED ORDER — WARFARIN SODIUM 5 MG PO TABS: 5.0000 mg | ORAL_TABLET | ORAL | Status: DC

## 2011-04-01 ENCOUNTER — Encounter: Payer: Medicare Other | Admitting: *Deleted

## 2011-04-08 ENCOUNTER — Ambulatory Visit (INDEPENDENT_AMBULATORY_CARE_PROVIDER_SITE_OTHER): Payer: BC Managed Care – PPO | Admitting: *Deleted

## 2011-04-08 DIAGNOSIS — Z7901 Long term (current) use of anticoagulants: Secondary | ICD-10-CM

## 2011-04-08 DIAGNOSIS — I4891 Unspecified atrial fibrillation: Secondary | ICD-10-CM

## 2011-04-19 NOTE — Assessment & Plan Note (Signed)
[facility]                          [facility] NOTE   [name]                     MRN:          [id number]  DATE:12/21/2007                            DOB:          [birth year]    REASON VISIT:  Scheduled clinic followup.   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 She is status post atrial flutter ablation, by [name], in 2004.   The patient's most recent echocardiogram in September, 2008, showed  normalization of LVF (EF 60-65%), with no significant valvular  abnormalities.   [name] also reviewed a CardioNet monitor which was suggestive of  probable, transient atrial fibrillation versus flutter. There was also  wide-complex tachycardia, felt to be secondary either to SVT aberrancy  versus ventricular tachycardia.   [name] suggested further evaluation with a repeat ischemic workup.  The patient was referred for an adenosine stress Cardiolite, which  suggested no significant change since the previous study of 2006.  This  was felt to be a low risk study with ejection fraction of 45%.  Of note,  the previous stress test in 2006, which was found to be abnormal,  preceded a heart catheterization which revealed nonobstructive CAD and  well-preserved LVF.   [name] also suggested up-titration of Toprol from 50mg  daily to  b.i.d. dosing, given the documented dysrhythmias.  However, the patient  informs me today that he was still awaiting clearance to do so.   Blood work was drawn consisting of a thyroid profile which revealed a  normal TSH of 1.11, but mildly decreased T4 of 5.7.  A followup free T4  was 0.55, but with a normal free T3 of 3.1.  The patient does not have  any known history of thyroid disease.   Clinically, the patient denies any angina pectoris or dyspnea.  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   With respect to palpitations, he continues to have these on an  infrequent basis, perhaps once a month, with no recent increase in  frequency.  They are minimally symptomatic and brief in duration.   Electrocardiogram today reveals atrial fibrillation at 74 BPM with left  axis deviation and persistent, symmetric T-wave inversion in the lateral  leads.   CURRENT MEDICATIONS:  1. Toprol XL 50 daily.  2. Lisinopril 10 mg daily.  3. Mag-Ox 400 daily.  4. Januvia 100 daily.  5. Potassium chloride 10 daily.  6. Lasix 20 p.r.n.  7. Lantus insulin as directed.  8. Caduet 10/20 daily.  9. Aspirin 81 daily.   PHYSICAL EXAMINATION:  VITAL SIGNS:  Blood pressure 127/81, pulse 65 and  regular, weight 284.  GENERAL:  A 65 year old male, morbidly obese, sitting upright in no  distress.  HEENT:  Normocephalic, atraumatic.  NECK:  Palpable bilateral pulse without bruits.  Unable to assess JVD  secondary to neck girth.  LUNGS:  Clear to auscultation in all fields.  HEART:  Irregularly, irregular (S1, S2), no significant murmurs.  No  rubs.  ABDOMEN:  Protuberant, nontender, intact bowel sounds.  EXTREMITIES:  1+ bilateral, nonpitting edema.  NEUROLOGIC:  No focal deficits.   IMPRESSION:  1. Recurrent atrial dysrhythmias.      a.     Documented atrial fibrillation, by current       electrocardiogram.      b.     History of atrial flutter, status post radiofrequency       ablation in 2004.  2. History of tachycardia-induced cardiomyopathy.      a.     Normalized left ventricular function, by cardiac       catheterization in 2006.      b.     Ejection fraction 60-65% with mild left ventricular       hypertrophy, by 2-D echocardiogram in September 2008.  3. Nonobstructive coronary artery  disease with low-risk Adenosine      stress Cardiolite, ejection fraction 49%, November 2008.  4. Insulin-dependent diabetes mellitus.  5. Dyslipidemia, followed by Dr. Meredith [name].  6. Chronic lower extremity edema.  7. Severe obstructive sleep apnea.  8. Hypertension.   PLAN:  1. Following review with [name], recommendation is to initiate      Coumadin anticoagulation given the documented recurrence of atrial      fibrillation.  The patient has been assessed to have a CHAD2 score      of 2, secondary to hypertension and diabetes mellitus.  Moreover,      he has been experiencing recurrent tachypalpitations for quite some      time, and had a previous history of documented atrial flutter.  He      will not be able to start Coumadin, however, until he is cleared by      his oral surgeon, with whom he is scheduled to followup early next      week.  The patient had recent dental surgery and is currently off      aspirin, as well.  Once he is cleared from a surgical standpoint,      [name] can start on Coumadin 5 mg daily with close monitoring      in our [facility], here in [place].  2. Up-titrate Toprol from 50 to 75 mg daily, for better heart rate and      blood pressure control.  3. The patient is encouraged to curtail his caffeinated beverage      intake.  4. Schedule return clinic followup with myself and [name] in 6      months.      Edgar Searing, PA-C  Electronically Signed      Edgar Sidle, MD  Electronically Signed   GS/MedQ  DD: 12/21/2007  DT: 12/21/2007  Job #: [id number]   cc:   [name], M.D.

## 2011-04-19 NOTE — Assessment & Plan Note (Signed)
[facility]                          [facility] NOTE   [name], [name]                     MRN:          [id number]  DATE:10/16/2007                            DOB:          [birth year]    PRIMARY CARE PHYSICIAN:  Meredith Mody, M.D.   REASON FOR VISIT:  Follow-up cardiac testing.   HISTORY OF PRESENT ILLNESS:  The patient was seen in office recently by  Tereso Newcomer, PA-C in August.  I last saw him in 2006.  He has a history  of mild coronary atherosclerosis documented at cardiac catheterization  in 2006 as well as hypertension, type 2 diabetes mellitus, and previous  tachycardia-induced cardiomyopathy due to atrial flutter, status post  atrial flutter ablation by Doylene Canning. Ladona Ridgel, M.D. in 2004.  He has not  been on longterm Coumadin.   When the patient was back in August, he had mentioned some palpitations  and was scheduled for an event recorder.  Tracings were evaluated by Learta Codding, MD,FACC and I note episodes of a wide complex nonsustained  tachycardia which is either indicative of nonsustained ventricular  tachycardia or an aberrant supraventricular tachycardia such as atrial  flutter.  He also had other episodes of wide complex tachycardia that  was more irregular and potential aberrant atrial fibrillation.  In  addition to this, there were narrow complex irregular two regular  tachycardias that may well represent atrial fibrillation/atrial flutter.  In any event, the patient was subsequently referred for an  echocardiogram that demonstrated normal left ventricular systolic  function with an ejection fraction of 60-65% with mild concentric left  ventricular hypertrophy and mild to moderate left atrial enlargement.  No other major valvular abnormalities were noted.  Blood work was  obtained showing a potassium of 4.3, magnesium 1.8, BUN 10, creatinine  0.9, and a TSH of 0.34 which is at the low end of normal.  I reviewed  these results with the patient today.   Symptomatically, the patient states of continued very brief rapid  palpitations lasting only a few seconds with no marked degree of  dizziness and no syncope or associated angina.  His only chest  discomfort related is described as a muscle soreness that he experienced  after lifting a very heavy object at work several weeks ago.  This has  subsequently resolved.   ALLERGIES:  No known drug allergies.   CURRENT MEDICATIONS:  1. Aspirin 325 mg p.o. daily.  2. Caduet 10/20 mg p.o. daily.  3. Lantus Insulin as directed.  4. Lasix 20 mg p.o. daily.  5. Potassium chloride 10 mEq p.o. daily.  6. Januvia 100 mg p.o. daily.  7. Magnesium oxide 400 mg p.o. b.i.d.  8. Toprol XL 50 mg p.o. daily.  9. Lisinopril 10 mg p.o. daily.  10.Nexium 40 mg p.o. p.r.n.   REVIEW OF SYSTEMS:  As described in history of present illness.   PHYSICAL EXAMINATION:  VITAL SIGNS:  Blood pressure 152/88, heart rate  is 88, weight is 285 pounds.  GENERAL:  Morbidly obese male in no acute distress.  HEENT:  Conjunctivae and  lids normal.  Oropharynx is clear.  NECK:  Supple.  No elevated jugular venous pressure.  No loud bruits.  No thyromegaly or thyroid tenderness.  LUNGS:  Clear without labored breathing.  HEART:  Regular rate and rhythm.  No loud murmur or S3 gallop.  ABDOMEN:  Soft, nontender, obese.  EXTREMITIES:  Chronic appearing edema, 1+.  SKIN:  Warm and dry.  MUSCULOSKELETAL:  No kyphosis is noted.  NEUROPSYCHIATRIC:  The patient is alert and oriented x3.  Affect is  normal.   IMPRESSION:  1. Intermittent brief palpitations with documentation of brief      episodes of probable atrial fibrillation versus atrial flutter,      although, some wide complex rhythms are also noted suggesting      either aberrant conduction of supraventricular arrhythmias versus      ventricular tachycardia.  The patient has had no angina and has      recently documented normal  left ventricular systolic function with      a prior history of minor coronary atherosclerosis at      catheterization in 2006.  He has not had a repeat ischemic      evaluation since then.  My recommendation is to increase Toprol XL      to 50 mg p.o. b.i.d., repeat a TSH with T3 and T4, and schedule an      Adenosine Cardiolite on medical therapy.  I will then have him      follow up in the office to discuss these results.  If his ischemic      evaluation is reassuring, we will more than likely continue medical      therapy and I will plan to discuss Coumadin with him at that time.      He does have a [place] score of 2 and with brief paroxysms of      recurrent atrial fibrillation/atrial flutter Coumadin would be a      consideration.  For now he will continue on full dose aspirin.  2. Further plans to follow.     Jonelle Sidle, MD  Electronically Signed    SGM/MedQ  DD: 10/16/2007  DT: 10/16/2007  Job #: [id number]

## 2011-04-19 NOTE — Assessment & Plan Note (Signed)
[facility]                          [facility] NOTE   NAME:[name], [name]                     MRN:          [id number]  DATE:04/23/2008                            DOB:          Aug 01, 1946    PRIMARY CARDIOLOGIST:  Dr. Simona Huh.   REASON FOR VISIT:  [name] is a very pleasant 65 year old male, well-  known to [place], who recently presented to Dr. Meredith Mody for regularly  scheduled follow-up.  He complained of chest pain and is now referred to  [place] for further evaluation.   The patient was last seen here in the clinic by me, in January of this  year.  He was found to have recurrent atrial fibrillation by EKG and we  thus started him on Coumadin, which has since been followed in our  clinic.   The patient complained of some chest pain during his recent visit.  However, he informs me today that this is a longstanding complaint, with  no recent exacerbation.  Specifically, he states that this is quite  unpredictable in onset.  In fact, he refers to some episodes which occur  1-2 days after he lifts heavy objects at [employer], where he works.  However, he also refers to some instances where he has chest tightness  with walking, but not in all cases.   The patient has been extensively evaluated in the past for ischemic  chest pain.  He had a cardiac catheterization in 2006 revealing  nonobstructive CAD with preserved LVF.  More recently, he had an  adenosine stress Cardiolite in February 2008, which was felt to  represent no significant change from an earlier study in 2000, which was  a false positive.  Also of note, this most recent Cardiolite test  yielded an EF of 45%, but with normal wall motion.  A 2-D echo 2 months  earlier yielded an EF of 60-65% with mild, concentric LVH and no  significant valvular abnormalities.   The patient also takes Nexium on occasion.  He cited a recent episode of  epigastric discomfort after eating,  which completely resolved after he  took Nexium.   Electrocardiogram today reveals atrial fibrillation at 72 bpm with left  axis deviation and chronic, diffuse T-wave changes, unchanged from his  previous study.   CURRENT MEDICATIONS:  1. Aspirin 325 daily.  2. Coumadin as directed.  3. Caduet 10/20 daily.  4. Lantus insulin as directed.  5. Lasix 20 daily.  6. KCl 10 daily.  7. Januvia 100 daily.  8. Mag-Ox 400 b.i.d.  9. Lisinopril 10 daily.  10.Toprol 75 daily.   PHYSICAL EXAMINATION:  VITAL SIGNS:  Blood pressure 159/92, pulse 77,  and regular weight 288 (up four).  GENERAL:  A 64 year old male, obese, sitting upright, no distress.  HEENT:  Normocephalic, atraumatic.  NECK:  Palpable bilateral pulse without bruits; unable to assess JVD  secondary to neck girth.  LUNGS:  Clear to auscultation in all fields.  HEART:  Irregular irregular (S1, S2).  No significant murmurs.  No rubs.  ABDOMEN:  Protuberant, nontender, intact bowel sounds.  EXTREMITIES:  Bilateral 1+ nonpitting edema.  NEUROLOGICAL:  Mildly flat affect, but no focal deficit.   IMPRESSION:  1. Recurrent, atypical chest pain.      a.     Nonobstructive CAD by cardiac catheterization in 2006.      b.     Preceded by false positive Cardiolite.      c.     Low-risk adenosine stress Cardiolite; EF of 49%, November       2008.  2. Permanent atrial fibrillation.      a.     Chronic Coumadin.      b.     Controlled ventricular response.  3. History of tachycardia - induced cardiomyopathy.      a.     Normalized LVF by cardiac catheterization in 2006.      b.     EF 60 - 65% with mild LVH by 2-D echo, September 2008.  4. Chronic lower extremity edema.  5. Severe obstructive sleep apnea, CPAP intolerant.  6. Hypertension.  7. Insulin-dependent diabetes mellitus.  8. Dyslipidemia.   PLAN:  1. Exercise stress echocardiogram for risk stratification.  The      patient's symptoms are longstanding and continue to  remain      atypical.  However, he does have several cardiac risk factors and      has not had a heart catheterization since 2006.  Rather then repeat      a perfusion imaging study which has been stable in the past, but      also abnormal including a previous false positive study in 2006, I      have elected to have a more sensitive evaluation with the GXT      echocardiogram.  Of note, if the patient is unable to achieve      target heart rate on the treadmill, then recommendation would be to      switch him to dobutamine as the stressor.  2. Patient advised to start taking Nexium on a daily basis.  He      clearly has symptoms suggestive of GERD and, to date, has only      taken Nexium on a p.r.n. basis.  3. Aggressive lipid management, per Dr. Meredith Mody, with target LDL of      70, or less.  4. The patient is advised to downtitrate aspirin back to 81 daily,      given that he is on Coumadin.  5. Scheduled return clinic follow-up with myself and Dr. Diona Browner in      one month, for review of stress test results and further      recommendations.      Rozell Searing, PA-C  Electronically Signed      Jonelle Sidle, MD  Electronically Signed   GS/MedQ  DD: 04/23/2008  DT: 04/23/2008  Job #: [id number]

## 2011-04-19 NOTE — Assessment & Plan Note (Signed)
[facility]                          [facility] NOTE   NAME:[name], [name]                     MRN:          [id number]  DATE:04/07/2009                            DOB:          02/23/1946    PRIMARY CARE PHYSICIAN:  Dr. Meredith Mody.   REASON FOR VISIT:  Scheduled followup.   HISTORY OF PRESENT ILLNESS:  [name] was seen in August of last year.  He reports no major sense of sudden onset palpitations in the setting of  his permanent atrial fibrillation.  He has not had any major bleeding  problems on Coumadin and he is due for a PT/INR today.  His  electrocardiogram shows atrial fibrillation at 80 beats per minute with  a left anterior fascicular block and nonspecific ST-T wave changes.  There has been no major change compared to his prior tracing.  He does  state that when he is exerting himself, he does feel like his heart  races a bit and I wonder if he is having increased rates more so than  necessary with activity.  We talked about advancing his Toprol-XL  somewhat.  Otherwise, he is not reporting any anginal chest pain.  He is  working 2 jobs now at [employer] and at [employer] system after hours.   ALLERGIES:  No known drug allergies.   MEDICATIONS:  1. Caduet 10/20 mg p.o. daily.  2. Potassium chloride 10 mEq p.o. daily.  3. Magnesium oxide supplements.  4. Lisinopril 10 mg p.o. daily.  5. Aspirin 81 mg p.o. daily.  6. Metformin 1000 mg p.o. b.i.d.  7. Lantus 70 units subcu q.a.m.  8. Metoprolol XL 50 mg p.o. q.a.m.  9. Coumadin as read by the [facility].   REVIEW OF SYSTEMS:  As outlined above.  Otherwise, reviewed negative.   PHYSICAL EXAMINATION:  VITAL SIGNS:  The patient blood pressure is  150/90, heart rate is 76 and irregular, weight is 282 pounds.  GENERAL:  He is a morbidly obese male in no acute distress.  HEENT:  Conjunctiva is normal.  Oropharynx is clear.  NECK:  Supple.  No elevated jugular venous  pressure.  No loud bruits.  No thyromegaly is noted.  LUNGS:  Clear with diminished breath sounds.  CARDIAC:  Irregularly irregular rhythm without loud murmur or gallop.  PMI is indistinct.  ABDOMEN:  Obese, nontender.  No obvious hepatomegaly, although difficult  to palpate liver edge.  EXTREMITIES:  Chronic-appearing edema, 1+ and nonpitting.  Distal pulses  are 1+.  SKIN:  Warm and dry.  MUSCULOSKELETAL:  No kyphosis noted.  NEUROPSYCHIATRIC:  The patient is alert and oriented x3.  Affect is  appropriate.   IMPRESSION AND RECOMMENDATIONS:  1. Permanent atrial fibrillation.  We will plan to continue Coumadin      and advance Toprol-XL to 75 mg daily.  This will hopefully help      attain better activity-related heart rates.  2. Previously documented nonobstructive coronary artery disease at      cardiac catheterization in 2006.  He has had a followup study  without frank ischemia and he is not reporting any new symptoms.      Plan will be to continue risk factor modification strategies.  I      will plan to see him back in the next 6 months.     Jonelle Sidle, MD  Electronically Signed    SGM/MedQ  DD: 04/07/2009  DT: 04/08/2009  Job #: [id number]   cc:   Dr. Meredith Mody

## 2011-04-19 NOTE — Assessment & Plan Note (Signed)
[facility]                          [facility] NOTE   NAME:WILSONMd, Smola                     MRN:          [id number]  DATE:07/12/2007                            DOB:          Jan 25, 1946    REASON FOR VISIT:  Two-year followup.   HISTORY OF PRESENT ILLNESS:  Mr. 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  He also has undergone cardiac catheterization in 2006  that showed minimal luminal irregularities.  His last echocardiogram  done in July 2006 revealed an EF of 60-65%.  He presents to the office  today for routine followup.   Since last being seen the patient has been diagnosed with obstructive  sleep apnea.  This is rated as severe.  He is somewhat noncompliant with  his CPAP therapy.  He wears it some nights.   He notes that he has overall been doing well.  He works at [employer] and  lifted some heavy items recently and noticed some chest discomfort.  This was more of a soreness and he has not had any symptoms since then.  He denies any exertional chest heaviness or tightness.  Denies any arm  or jaw discomfort.  He does note some mild dyspnea with exertion.  This  is fairly stable.  He describes  NYHA class 2 symptoms.  He denies any  true orthopnea or PND.  He has had some pedal edema recently.  His left  leg has been worse than his right.  He had recent venous Doppler's  performed at [facility] that were negative for DVT.   He does tell me today that he had tachy-palpitations about a month or so  ago.  He realized he was not taking his Toprol.  He started back on this  and it has promptly resolved.  He did feel light-headed with these but  denies any syncope.   CURRENT MEDICATIONS:  1. Nexium 40 mg daily p.r.n.  2. Aspirin 81 mg daily.  3. Caduet 10 mg/20 mg daily.  4. Lantus insulin as directed.  5. Toprol XL 50 mg, 1/2 tablet a day.  6. Lasix 20 mg daily.  7. Potassium 10 mEq a day.  8. Januvia 100 mg daily.   ALLERGIES:  No known drug allergies.   PHYSICAL EXAMINATION:  GENERAL:  He is a well-nourished, well-developed  man in no distress.  VITAL SIGNS:  Blood pressure 191/89.  Pulse 74.  Weight 289 pounds.  Repeat blood pressure 175/86 on the right, 172/81 on the left.  HEENT:  Normal.  NECK:  Without JVD.  LYMPH:  Without lymphadenopathy.  ENDOCRINE:  Without thyromegaly.  CAROTIDS:  Without bruits bilaterally.  CARDIAC:  Normal S1, S2.  Regular rate and rhythm.  LUNGS:  Clear to auscultation bilaterally without wheezes, rhonchi, or  rales.  ABDOMEN:  Soft and nontender with normoactive bowel sounds.  No  organomegaly.  EXTREMITIES:  With trace to 1+ edema on the right, 1+ edema on the left.  Calves are soft, nontender.  Distal pulses are intact.  Electrocardiogram reveals a sinus rhythm with a heart rate of 64, normal  axis.  T-wave inversions in lead 1 and AVL, and V4 through V6.  Minimal  voltage criteria for LVH.  No significant changes from previous tracing  dated June 29, 2006.   IMPRESSION:  1. Palpitations.  2. Uncontrolled hypertension.  3. Minimal luminal irregularities by cardiac catheterization 2006.  4. Preserved left ventricular function with an EF of 60-65% by      echocardiogram done July 2006.      a.     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  5. History of atrial flutter status post radiofrequency catheter      ablation in 2004.  6. Diabetes mellitus.  7. Treated dyslipidemia.      a.     Followed by primary care physician.  8. History of Bell's palsy.  9. Peripheral edema.   PLAN:  The patient presents to the office today for two-year followup.  He has generally been doing well.  He had some mild palpitations a  couple of weeks ago and started back on his Toprol  with complete  resolution.  He does have a history of atrial flutter.  His [place] score  is 2 with positive hypertension and diabetes mellitus.  At this point in  time we plan to:  1. Set him up with a 30-day cardiac even monitor to rule out tachy-      arrhythmias.  He would certainly be a Coumadin candidate should be      having episodes of paroxysmal atrial fibrillation/flutter.  2. Continue on aspirin for now.  3. Obtain an echocardiogram to ensure that his LV function has      remained stable.  4. Initiate lisinopril 10 mg a day for blood pressure control and      renal protection given his history of diabetes mellitus.  5. Obtain a BMET, magnesium, and TSH level today.  6. Follow-up BMET in 7-10 days.  7. Follow up with Dr. Simona Huh in the next six weeks.      Tereso Newcomer, PA-C  Electronically Signed      Jonelle Sidle, MD  Electronically Signed   SW/MedQ  DD: 07/12/2007  DT: 07/12/2007  Job #: [id number]   cc:   Ardyth Man, M.D.

## 2011-04-19 NOTE — Assessment & Plan Note (Signed)
[facility]                          [facility] NOTE   NAME:WILSONHomero, Hyson                     MRN:          [id number]  DATE:08/04/2008                            DOB:          May 06, 1946    PRIMARY CARE PHYSICIAN:  Dr. Meredith Mody.   REASON FOR VISIT:  Followup cardiac testing.   HISTORY OF PRESENT ILLNESS:  Mr. Hoos was seen back in May.  He has a  history of nonobstructive coronary artery disease based on cardiac  catheterization in 2006 as well as permanent atrial fibrillation managed  with a strategy of heart rate control and anticoagulation.  At the time  of his last visit, he was describing symptoms of chest pain that were  somewhat atypical, although he had not had a followup ischemic  evaluation.  He was referred for a dobutamine echocardiogram, which  demonstrated no clear evidence of ischemia by echocardiographic images  with overall normal left ventricular systolic function.  I discussed  this with the patient again today.  He states that he is not having any  significant exertional chest pain.  He has only occasional palpitations.  He is due for followup INR, last being 2.0 in late July.  He is not  having any bleeding problems.   ALLERGIES:  No known drug allergies.   PRESENT MEDICATIONS:  1. Caduet 10/20 mg p.o. daily.  2. Lantus as directed.  3. Potassium chloride 10 mEq p.o. daily.  4. Januvia 100 mg p.o. daily.  5. Magnesium oxide.  6. Lisinopril 10 mg p.o. daily.  7. Toprol-XL 25 mg p.o. daily.  8. Aspirin 81 mg p.o. daily.  9. Coumadin as directed by the [facility].   REVIEW OF SYSTEMS:  As described in the history of present illness.  Otherwise negative.   PHYSICAL EXAMINATION:  VITAL SIGNS:  Blood pressure today is 140/81,  heart rate is 73, and weight is 291 pounds.  GENERAL:  He is a morbidly obese male in no acute distress.  NECK:  Reveals no elevated jugular venous pressure.  No audible  bruits.  No thyromegaly is noted.  LUNGS:  Clear without labored breathing at rest.  CARDIAC:  Irregularly irregular rhythm without pathologic murmur or S3  gallop.  EXTREMITIES:  Exhibit 1+ nonpitting edema.   IMPRESSION AND RECOMMENDATIONS:  1. Nonobstructive coronary atherosclerosis documented at      catheterization in 2006 with no clear evidence of ischemia by      dobutamine echocardiogram done recently.  Our plan will be to      continue medical therapy and symptom observation over the next 6      months.  2. Permanent atrial fibrillation, managed with a strategy of heart      rate control and anticoagulation.  He is due for a followup INR      today.  He seems to be fairly symptomatically stable in this      regard.     Jonelle Sidle, MD  Electronically Signed    SGM/MedQ  DD: 08/04/2008  DT: 08/05/2008  Job #: [id number]   cc:  Meredith Mody, MD

## 2011-04-22 NOTE — H&P (Signed)
NAME:  [name], [name]                            ACCOUNT NO.:  [id number]   MEDICAL RECORD NO.:  [id number]                   PATIENT TYPE:  INP   LOCATION:  2004                                 FACILITY:  [facility]   PHYSICIAN:  Salvadore Farber, M.D.             DATE OF BIRTH:  [birth year]   DATE OF ADMISSION:  04/01/2003  DATE OF DISCHARGE:                                HISTORY & PHYSICAL   CHIEF COMPLAINT:  Dyspnea, throat tightness, weakness.   HISTORY OF PRESENT ILLNESS:  The patient is a 65 year old gentleman with no  prior history of cardiac disease.  He now presents with a three-week history  of exertional dyspnea, mild orthopnea, and two to three episodes of brief  (approximately five minutes) nonexertional throat tightness.  Today, he  presented to Dr. Arville Care for evaluation of these complaints.  There, he was  found to have atrial flutter and hypertension.  The patient reports blood  pressure of 180/120.  He was started on two new medications, patient unsure  what they were.  After taking these medications, this evening he felt weak,  diaphoretic, and had approximately 10 minutes of throat tightness that  resolved spontaneously.  He presented to [facility], where his  heart rate was 68; blood pressure was 91/61.  He was given IV fluids and  rapidly became completely asymptomatic.  He was transferred for further  evaluation.  Upon arrival, he is completely asymptomatic.   PAST MEDICAL HISTORY:  None.   MEDICATIONS PRIOR TO TODAY:  1. Aspirin 325 mg per day.  2. Ibuprofen p.r.n.  3. The patient started two new medications of which he is unaware.   ALLERGIES:  No known drug allergies.   SOCIAL HISTORY:  The patient works in a [employer].  Denies tobacco and  alcohol use.  He is married.   FAMILY HISTORY:  Father died at 45 of emphysema.  Mother died in her 57s of  possible complications of diabetes mellitus.  The patient has two sisters  who are alive and  well.   REVIEW OF SYSTEMS:  Negative in detail except as above.   PHYSICAL EXAMINATION:  GENERAL/VITAL SIGNS:  This is a morbidly obese man in  no distress with heart rate of 67, blood pressure 128/76, oxygen saturation  of 98% on 2 L.  He is afebrile.  NECK:  I am unable to see his jugular venous pulsations.  Carotid pulses are  2+ bilaterally without bruit.  LUNGS:  His lungs are clear to auscultation.  CARDIAC:  He has a regular rate and rhythm without murmur.  There is a  prominent S3.  ABDOMEN:  Abdomen is obese, soft, nondistended and nontender.  There are  normal bowel sounds.  EXTREMITIES:  Extremities are warm without edema.   LABORATORY STUDIES:  Laboratory studies remarkable for sodium 132, potassium  4.1, creatinine 1.6, glucose 358, hematocrit 47,  platelets 213,000, TSH 1.3,  CPK 272, CK-MB 7.7, troponin I of 0.27.   Electrocardiogram demonstrates atrial flutter with a ventricular rate of 62  beats per minute.  There is poor R wave progression.   IMPRESSION/RECOMMENDATION:  [age]-year-old gentleman without prior  history of cardiac disease, presents with atrial flutter complicated by  congestive heart failure and slightly elevated troponin.  Based on his  symptoms, the atrial flutter appears to be of greater than three weeks'  duration.  It appears he became hypotensive in response to the medications  that were added today, resulting in this evening's symptoms.  I suspect that  his troponin elevation is due to transient hypotension.  Acute coronary  syndrome is much less likely.  We will check serial enzymes.   His congestive heart failure is symptomatically mild but the prominent third  heart sound is concerning.  We will check echocardiogram to assess left  ventricular systolic function.  We will assess diabetes with hemoglobin A1c  and cover with sliding-scale insulin for time-being.  We will add low-dose  beta blocker for heart rate control and adjust upward  as necessary.                                                Salvadore Farber, M.D.    WED/MEDQ  D:  04/02/2003  T:  04/02/2003  Job:  [id number]   cc:   Jonita Albee, [address] Dr. Arville Care

## 2011-04-22 NOTE — Cardiovascular Report (Signed)
NAME:  [name], ROCHFORD                  ACCOUNT NO.:  [id number]   MEDICAL RECORD NO.:  [id number]          PATIENT TYPE:  OIB   LOCATION:  6501                         FACILITY:  [facility]   PHYSICIAN:  Arturo Morton. Riley Kill, M.D. [facility] OF BIRTH:  15-Mar-1946   DATE OF PROCEDURE:  07/08/2005  DATE OF DISCHARGE:  07/08/2005                              CARDIAC CATHETERIZATION   INDICATIONS:  Mr. Grell is a gentleman who was an antecedent history of  cardiomyopathy probably on the basis of hypertension or tachycardia with  atrial flutter.  His overall LV function is improved.  Echocardiography  reveals evidence of marked left ventricular hypertrophy EKG suggests  repolarization abnormality.  He was recently admitted to the hospital where  he was recently seen with chest pain.  Troponin I levels were up to 0.05.  He had relative bradycardia, and his Toprol was decreased.  Adenosine  Cardiolite suggested transient ischemic dilatation with lateral and mid-  anterior defects although not calculated as significant.  Dr. Diona Browner  reviewed the findings with the patient and his wife, and cardiac  catheterization was recommended.  Risks, benefits, alternatives were  reviewed with the patient.  He was agreeable to proceed.   PROCEDURE:  1.  Left heart catheterization  2.  Selective coronary territory.  3.  Selective left ventriculography.   CARDIOLOGIST:  Arturo Morton. Riley Kill, M.D.   DESCRIPTION OF PROCEDURE:  The patient was brought to the catheterization  lab and prepped and draped in usual fashion.  Through an anterior puncture  the right femoral artery was easily entered.  A  4-French sheath was  initially placed.  We were unable to opacify the left coronary artery very  well using a 4-French catheter.  This was gradually upgraded to a 5-French.  We also selectively engaged the conus branch on the right coronary and  therefore a right bypass catheter was utilized to eventually engage the  right  coronary ostium.  Central aortic and left ventricular pressures were  measured with the pigtail. Ventriculography was performed in the RAO  projection.  Because of elevated blood pressures he was given about 30 mg of  intravenous labetalol.  He tolerated the procedure well and there were no  complications.   HEMODYNAMIC DATA:  1.  Central aortic pressure 175/90, mean of 123.  2.  Left ventricular pressure 170/21  3.  No gradient pullback across aortic valve.   ANGIOGRAPHIC DATA:  1.  The ventriculography was performed in the RAO projection.  There      appeared to be left ventricular hypertrophy as noted previously on      echocardiography.  Systolic function appeared vigorous.  2.  The left main was free of critical disease.  3.  Left anterior descending artery demonstrates perhaps a very mild      irregularity after the septal in between the origins of the diagonals.      There does not appear to be significant focal narrowing and this was      primarily in the area that looks to go intramyocardially.  The mid and  distal LAD appear free of critical disease.  The diagonals also appear      free of disease.  4.  The circumflex consists of three marginal branches.  The first one was      proximal, the second one bifurcates and the third was in the      posterolateral segment.  No critical areas of high-grade obstruction are      noted.  5.  The right coronary artery has an anterior takeoff and consists of a      single posterior descending branch.  This appears also free of critical      disease.   CONCLUSIONS:  1.  Well-preserved left ventricular function with evidence of left      ventricular hypertrophy.  2.  Minor irregularity. The left anterior descending is without significant      high-grade areas of focal stenosis.   DISPOSITION:  Based on the current study, there is not critical coronary  disease requiring treatment.  He does have significant hypertension which   clearly needs treatment.  The patient is also overweight, and there is  potentially a suggestion according to the wife of sleep apnea. This may need  to be investigated further.   ADDENDUM:  Followup will be in [place] with Dr. Diona Browner.       TDS/MEDQ  D:  07/08/2005  T:  07/09/2005  Job:  [id number]   cc:   Jonelle Sidle, M.D. [facility]   CV Laboratory   Meredith Mody, M.D.

## 2011-04-22 NOTE — Discharge Summary (Signed)
NAME:  [name], [name]                            ACCOUNT NO.:  [id number]   MEDICAL RECORD NO.:  [id number]                   PATIENT TYPE:  INP   LOCATION:  2004                                 FACILITY:  [facility]   PHYSICIAN:  Veneda Melter, M.D.                   DATE OF BIRTH:  04/27/1946   DATE OF ADMISSION:  04/01/2003  DATE OF DISCHARGE:  04/11/2003                           DISCHARGE SUMMARY - REFERRING   PROCEDURES:  1. Cardiac catheterization.  2. Coronary arteriogram.  3. Left ventriculogram.  4. Swallowing function study.  5. A 2-D echocardiogram.   HOSPITAL COURSE:  The patient is a 65 year old male with no known history of  coronary artery disease.  He complained of a three-week history of  exertional dyspnea, orthopnea, and two to three episodes of nonexertional  throat tightness.  He saw his family physician (Dr. Willaim Bane) for evaluation and  was found to be in atrial flutter and also to be significantly hypertensive  with a blood pressure of 180/120.  Dr. Willaim Bane started him on two medications,  believed to be Cardizem and Toprol XL, and he continued to have symptoms,  including weakness, diaphoresis, and throat tightness.  He went to [facility]  emergency room, and his heart rate was 68, but his blood pressure was only  91/61.  He became asymptomatic after getting IV fluids and was transferred  to [facility] for further evaluation and treatment.   He was found to still be in an atrial flutter with the heart rate poorly  controlled.  Additionally, he had some congestive heart failure.  Because of  the throat tightness as well as the atrial flutter, it was felt that a  cardiac catheterization was indicated to rule out structural heart disease.   The cardiac catheterization showed a normal left main and an LAD with two  20% lesions.  The circumflex and ramus intermedius systems were without  disease.  The RCA had a 30% proximal stenosis.  His EF was 25 to 30% with 3+  MR and left atrial enlargement.  It was felt that he had left ventricular  dysfunction secondary to tachycardia and needed anticoagulation.  It was  felt that anticoagulation with cardioversion in four to six weeks was the  best option.   As part of his evaluation, the patient also had a 2-D echocardiogram; it  showed an EF of 40 to 50% with mild, diffuse left ventricular hypokinesis.  The aortic valve was mildly to moderately calcified with mildly reduced  aortic valve nerve root excursion and trivial aortic valvular regurgitation.  There was mild mitral annular calcification with mild to moderate MR, and  the left atrium was moderately to markedly dilated.  There was moderate TR,  and the right atrium was dilated as well.  He was started on heparin and had  Coumadin added to his medication regimen.   The  patient was hyperglycemic on arrival with a blood sugar of 264.  He had  a hemoglobin A1c checked, and it was elevated at 10.4, so an internal  medicine consult was called.   The patient was seen by [facility], and he had Glucotrol  b.i.d. added to his medication regimen as well as 10 units of subcu Lantus.  He is referred for outpatient diabetic education at [facility].  Additionally,  he had borderline renal insufficiency with a creatinine of 1.6; this  resolved, and his BUN and creatinine were within normal limits at discharge.  His TSH was within normal limits as well.  A lipid profile was performed and  showed a total cholesterol of 178 with triglycerides 94, HDL 43, LDL 116.  Because his LDL was elevated, a statin was added to his medication regimen  as well.   In order to control his hypertension, he was started on a beta blocker,  which was gradually up-titrated to Toprol XL 100 mg daily.  Additionally, he  had an ACE inhibitor added because of his hypertension and diabetes, and  this was up-titrated to 10 mg p.o. b.i.d.  He had Lasix added to his  medication  regimen at 20 mg p.o. daily, and his potassium remained steady at  4.0 on this.  The patient continued to be observed while his INR increased  and was seen by care management, but no discharge needs were identified.  The patient was considered stable for discharge once his INR reached  therapeutic, which it did on 04/11/2003.  He is to follow up with Dr. Ladona Ridgel  as an outpatient and schedule an ablation for the atrial flutter at that  time.   LABORATORY VALUES:  Hemoglobin 15.1, hematocrit 46.8, WBCs 9.4, platelets  210.  Sodium 136, potassium 4.0, chloride 103, CO2 of 26, BUN 11, creatinine  0.9, glucose 167.  INR at discharge 2.5.   Chest x-ray:  There is vascular congestion, and the cardiac size is  enlarged.  A more confluent opacity is seen at the lung bases, which could  represent edema or early infection.  Degenerative changes are seen in the  spine.   DISCHARGE CONDITION:  Improved.   DISCHARGE DIAGNOSES:  1. Atrial flutter.  2. Chest pain, no critical coronary artery disease by catheterization with     3+ mitral regurgitation.  3. Left ventricular dysfunction secondary to tachycardia with an ejection     fraction of 25 to 30% by catheterization and 40 to 50% by echocardiogram     this admission.  4. Diabetes mellitus.  5. Anticoagulation started this admission.  6. Hypertension.  7. Hyperlipidemia.  8. Nonsustained ventricular tachycardia.  9. Possible osteoarthritis.   DISCHARGE INSTRUCTIONS:  1. His activity level is to be as tolerated.  2. He is to get a pro time done at the [place] office at 9 a.m. on Monday.   DIET:  He is to stick to a low-fat and -salt diabetic diet with outpatient  nutrition consult.   FOLLOW UP:  1. He is to see Dr. Willaim Bane and call for an appointment.  2. He is to see Dr. Ladona Ridgel on May 13, 2003, at 11:45 a.m.   DISCHARGE MEDICATIONS:  1. Coated aspirin 81 mg daily.  2. Glucotrol 5 mg b.i.d. 3. Lantus 10 units q.h.s.  4. Altace 10 mg  b.i.d.  5. Lasix 20 mg daily.  6.     Lipitor 20 mg daily.  7. Coumadin 10 mg daily or  as directed.  8. Toprol XL 100 mg daily.     Lavella Hammock, P.A. [facility]                  Veneda Melter, M.D.    RG/MEDQ  D:  04/11/2003  T:  04/12/2003  Job:  [id number]   cc:   Dr. Mike Craze, [place]   Salvadore Farber, M.D.   [facility]  [place], [place]

## 2011-04-22 NOTE — Op Note (Signed)
NAME:  [name], [name]                            ACCOUNT NO.:  [id number]   MEDICAL RECORD NO.:  [id number]                   PATIENT TYPE:  OIB   LOCATION:  2899                                 FACILITY:  [facility]   PHYSICIAN:  Doylene Canning. Ladona Ridgel, M.D.               DATE OF BIRTH:  [birth year]   DATE OF PROCEDURE:  06/19/2003  DATE OF DISCHARGE:                                 OPERATIVE REPORT   PROCEDURE PERFORMED:  Invasive electrophysiologic study followed by  radiofrequency catheter ablation of atrial flutter.   INTRODUCTION:  The patient is a 65 year old man is previously known to be  healthy, who initially presented with atrial flutter and borderline cardiac  enzymes back in April 2004.  At that time because of elevated cardiac  enzymes he was treated with A-V nodal blocking drugs and Coumadin.  He was  found to have severe LV dysfunction.  He was discharged home and is now  referred for electrophysiologic study and catheter ablation.   PROCEDURE:  After informed consent was obtained, the patient was taken to  the diagnostic EP lab in a fasted state.  After the usual preparation and  draping, intravenous fentanyl and midazolam were given for sedation.  A 6  French hexapolar catheter was inserted percutaneously in the right jugular  vein and advanced to the coronary sinus.  A 7 French 20-pole halo catheter  was inserted percutaneously in the right femoral vein and advanced to the  right atrium.  A 5 French quadripolar catheter was inserted percutaneously  in the right femoral vein and advanced to the His bundle region.  After  measurement of the basic intervals, mapping was carried out demonstrating  typical counterclockwise tricuspid annular reentrant atrial flutter.  The  ablation catheter was then maneuvered into the usual atrial flutter isthmus.  The atrial flutter isthmus was quite large with a large portion of the  isthmus anterior to the CS os.  Nine RF energy applications were  delivered  to the region between the septal leaflet of the tricuspid valve and the  inferior vena cava.  During the ninth RF energy application atrial flutter  was terminated and sinus rhythm was restored.  The patient demonstrated the  isthmus conduction to be intact.  Three additional RF energy applications  were then delivered to the anterior aspect of the atrial flutter isthmus  very close to the tricuspid valve.  Isthmus block was demonstrated.  Two  bonus RF energy applications were then delivered.  The patient was observed  for 45 minutes, and during this time he had no residual isthmus conduction.  The catheters were removed, hemostasis was assured, and the patient returned  to his room in satisfactory condition.   COMPLICATIONS:  There were no immediate procedural complications.   RESULTS:  A.  BASELINE ELECTROCARDIOGRAM:  The baseline ECG demonstrates atrial  flutter with a flutter cycle length of  240 msec.   B.  BASELINE INTERVALS:  The HV interval was 55 msec.  The atrial flutter  cycle length was 240 msec.  Following ablation, the HV interval remained at  55 and the AH interval 86 msec.   C.  RAPID ATRIAL PACING:  Following catheter ablation, rapid atrial pacing  was carried out from the coronary sinus demonstrating an AV Wenckebach cycle  length of 430 msec.  During rapid atrial pacing the PR interval remained  less than the RR interval, and there was no inducible SVT.   D.  PROGRAMMED ATRIAL STIMULATION:  Programmed atrial stimulation was  carried out from the coronary sinus at a basic drive cycle length of 161  msec.  The S1-S2 interval was stepwise decreased from 540 msec down to 340  msec, where the AV node ERP was observed.  During programmed atrial  stimulation there were no AH jumps and no echo beats.   E.  ARRHYTHMIAS OBSERVED:  Atrial flutter.  Initiation:  Present at the time  of EP study.  Duration:  Sustained.  Termination:  Catheter ablation.   F.   MAPPING:  Mapping demonstrated the usual atrial flutter isthmus.   G.  RADIOFREQUENCY ENERGY APPLICATION:  A total of 14 RF energy applications  were delivered, resulting in termination of atrial flutter, restoration of  sinus rhythm, and creation of  bidirectional block in the atrial flutter  isthmus.   CONCLUSION:  This study demonstrates successful electrophysiologic study and  RF catheter ablation of typical atrial flutter with a total of 14 RF energy  applications delivered to the usual atrial flutter isthmus, resulting in  termination of atrial flutter and restoration of sinus rhythm.                                               Doylene Canning. Ladona Ridgel, M.D.    GWT/MEDQ  D:  06/19/2003  T:  06/19/2003  Job:  [id number]   cc:   Salvadore Farber, M.D.   Kathrine Cords, R.N. [facility]

## 2011-04-22 NOTE — Discharge Summary (Signed)
NAME:  [name], [name]                            ACCOUNT NO.:  [id number]   MEDICAL RECORD NO.:  [id number]                   PATIENT TYPE:  OIB   LOCATION:  3733                                 FACILITY:  [facility]   PHYSICIAN:  Duke Salvia, M.D.               DATE OF BIRTH:  July 24, 1946   DATE OF ADMISSION:  06/19/2003  DATE OF DISCHARGE:  06/20/2003                                 DISCHARGE SUMMARY   PRIMARY DISCHARGE DIAGNOSIS:  Atrial flutter.   SECONDARY DIAGNOSES:  1. Congestive heart failure  2. Diabetes.  3. Dyslipidemia.  4. Hypertension.   HISTORY OF PRESENT ILLNESS:  A 65 year old gentleman with past medical  history of CHF, atrial flutter with rapid ventricular response, who was  treated with A-V nodal blocking drugs and being admitted now for atrial  flutter ablation.   HOSPITAL COURSE:  As stated above.  The patient underwent a slow pathway  modification.  He had no immediate postoperative complications, was  discharged later that day to home in stable condition on all of his previous  discharge medications.   DISCHARGE MEDICATIONS:  1. Coumadin 15 mg tomorrow, then resume previous dose of 10 mg daily except     Sunday, 15.  2. Lovenox 120 mg subcutaneously every 12 hours for the next 2 days.  3. Altace 10 mg b.i.d.  4. Glipizide 5 mg b.i.d.  5. Lasix 20 mg daily.  6. Toprol XL 100 mg daily.  7. Lipitor 20 mg nightly.  8. Norvasc 5 mg daily.  9. Lantus 10 units nightly.  10.      Coated aspirin 81 mg daily for 6 weeks.  11.      Antibiotics before urinary, dental, or bowel procedures for the     next six months.  12.      Tylenol 1 to 2 tablets every 4 to 6 hours as needed.   ACTIVITY:  No heavy lifting or strenuous activity for four days, no driving  for two.   DIET:  Low-fat, low-salt, low-cholesterol.   FOLLOW UP:  He is to call if he develops any drainage or lumps in groin or  neck.  He was scheduled for PT/INR on Monday, July 19, after 8;30 a.m.  at  the [place] office in [place].  He was to follow up with Dr. Diona Browner in [place]  on August 13 at 10 a.m.     Chinita Pester, C.R.N.P. [facility]                 Duke Salvia, M.D.    DS/MEDQ  D:  06/20/2003  T:  06/21/2003  Job:  [id number]   cc:   Doylene Canning. Ladona Ridgel, M.D.   Jonelle Sidle, M.D. [facility]   Kathrine Cords, R.N. [facility], [name]    cc:   Doylene Canning. Ladona Ridgel, M.D.   Jonelle Sidle,  M.D. [facility]   Kathrine Cords, R.N. [facility], [place]

## 2011-04-22 NOTE — Cardiovascular Report (Signed)
NAME:  AMARION, PORTELL                            ACCOUNT NO.:  [id number]   MEDICAL RECORD NO.:  [id number]                   PATIENT TYPE:  INP   LOCATION:  2004                                 FACILITY:  [facility]   PHYSICIAN:  Veneda Melter, M.D.                   DATE OF BIRTH:  Nov 11, 1946   DATE OF PROCEDURE:  04/03/2003  DATE OF DISCHARGE:                              CARDIAC CATHETERIZATION   PROCEDURE PERFORMED:  1. Left heart catheterization.  2. Left ventriculogram.  3. Selective coronary angiography.  4. Matrix closure, right femoral artery.   DIAGNOSES:  1. Mild coronary artery disease by angiogram.  2. Severe left ventricular systolic dysfunction.  3. Atrial flutter.  4. Mitral regurgitation, 3+.   HISTORY:  The patient is a 65 year old black man who presents with  congestive heart failure and new onset atrial flutter.  The patient had  substernal chest discomfort during presentation and subsequently ruled in  for a small non-Q-wave myocardial infarction with elevation of cardiac  enzymes.  He was found to be in new onset atrial flutter that has been  anticoagulated and rate-controlled.  He presents for further cardiac  assessment.   TECHNIQUE:  Informed consent was obtained.  The patient was brought to the  catheterization lab.  A 6-French sheath was placed in the right femoral  artery using the modified Seldinger technique.  JL4 and JR4 6-French  catheters were then used to engage the left and right coronary arteries, and  selective angiography was performed in various projections using manual  injections of contrast.  A 6-French pigtail catheter was then advanced to  the left ventricle, and left ventriculogram performed using power injections  of contrast.  At the termination of the case, the catheters and sheath were  removed, and a Matrix closure device deployed to the right femoral artery  until adequate hemostasis was achieved.  The patient tolerated the  procedure  well and was transferred to the floor in stable condition.  Findings are as  follows:   FINDINGS:  1. Left main trunk:  Large caliber vessel with mild irregularities.  2. LAD:  This begins as a large caliber vessel and tapers as it approaches     the apex.  It provides 2 diagonal branches in the mid section.  The LAD     has mild diffuse irregularities of 20% in the proximal to mid section.     The first diagonal branch has a proximal narrowing of 30%.  3. Left circumflex artery:  Medium caliber vessel that provides 2 marginal     branches.  The AV circumflex has mild disease of 20%.  The first marginal     branch arises from the mid section and bifurcates in its proximal     segment.  It has mild irregularities.  The large second marginal branch     has mild  disease of 20% to 30%.  4. Right coronary:  Dominant.  This is a medium caliber vessel that provides     a small posterior descending artery in its terminal segment.  The right     coronary artery has focal eccentric plaque of 30% in the mid section.     There are mild irregularities of 15% to 20% in the distal section.  5. Ramus intermedius:  This is a large caliber vessel that provides the high     lateral wall.  This vessel has mild disease of 20% to 30%.  6. LV:  Normal dimension.  The left ventricular systolic function is     severely depressed with ejection fraction approximately 25%.  There is     global hypokinesis.  Mitral regurgitation, 3+ to 4+, is noted with left     atrial enlargement.   HEMODYNAMICS:  1. LV pressure is 140/20.  2. Aortic is 140/100.  3. LVEDP equals 40.   ASSESSMENT AND PLAN:  The patient is a 65 year old gentleman with mild  coronary artery disease with severe left ventricular dysfunction.  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  Rate  control and anticoagulation will be continued, and medication aimed towards  treatment of left ventricular  dysfunction.  Cardioversion should be  considered in 4 to 6 weeks' time.  Medical therapy will be pursued for the  patient's coronary disease.                                               Veneda Melter, M.D.    Melton Alar  D:  04/03/2003  T:  04/04/2003  Job:  [id number]   cc:   Dr. Arma Heading, M.D.

## 2011-05-06 ENCOUNTER — Encounter: Payer: BC Managed Care – PPO | Admitting: *Deleted

## 2011-05-16 ENCOUNTER — Encounter: Payer: Self-pay | Admitting: *Deleted

## 2011-05-31 ENCOUNTER — Encounter: Payer: BC Managed Care – PPO | Admitting: *Deleted

## 2011-06-02 ENCOUNTER — Other Ambulatory Visit: Payer: Self-pay | Admitting: Cardiology

## 2011-06-02 NOTE — Telephone Encounter (Signed)
[place] pt. 

## 2011-06-14 ENCOUNTER — Other Ambulatory Visit: Payer: Self-pay | Admitting: Cardiology

## 2011-06-14 ENCOUNTER — Ambulatory Visit (INDEPENDENT_AMBULATORY_CARE_PROVIDER_SITE_OTHER): Payer: BC Managed Care – PPO | Admitting: *Deleted

## 2011-06-14 DIAGNOSIS — Z7901 Long term (current) use of anticoagulants: Secondary | ICD-10-CM

## 2011-06-14 DIAGNOSIS — I4891 Unspecified atrial fibrillation: Secondary | ICD-10-CM

## 2011-06-14 LAB — POCT INR: INR: 1.9

## 2011-06-14 NOTE — Telephone Encounter (Signed)
[place] pt. 

## 2011-06-14 NOTE — Telephone Encounter (Signed)
[name] Pt

## 2011-06-30 ENCOUNTER — Other Ambulatory Visit: Payer: Self-pay | Admitting: Cardiology

## 2011-06-30 NOTE — Telephone Encounter (Signed)
[place] Patient 

## 2011-07-01 ENCOUNTER — Telehealth: Payer: Self-pay | Admitting: Cardiology

## 2011-07-01 MED ORDER — MAGNESIUM OXIDE 400 MG PO TABS: 400.0000 mg | ORAL_TABLET | Freq: Two times a day (BID) | ORAL | Status: DC

## 2011-07-01 NOTE — Telephone Encounter (Signed)
.   Requested Prescriptions   Signed Prescriptions Disp Refills  . magnesium oxide (MAG-OX) 400 MG tablet 60 tablet 3    Sig: Take 1 tablet (400 mg total) by mouth 2 (two) times daily.    Authorizing Provider: DE Karma Lew    Ordering User: Lacie Scotts

## 2011-07-12 ENCOUNTER — Ambulatory Visit (INDEPENDENT_AMBULATORY_CARE_PROVIDER_SITE_OTHER): Payer: BC Managed Care – PPO | Admitting: *Deleted

## 2011-07-12 DIAGNOSIS — Z7901 Long term (current) use of anticoagulants: Secondary | ICD-10-CM

## 2011-07-12 DIAGNOSIS — I4891 Unspecified atrial fibrillation: Secondary | ICD-10-CM

## 2011-07-12 LAB — POCT INR: INR: 2.9

## 2011-07-29 ENCOUNTER — Encounter: Payer: Self-pay | Admitting: *Deleted

## 2011-08-01 ENCOUNTER — Encounter: Payer: BC Managed Care – PPO | Admitting: *Deleted

## 2011-08-01 ENCOUNTER — Ambulatory Visit: Payer: BC Managed Care – PPO | Admitting: Cardiology

## 2011-08-09 ENCOUNTER — Ambulatory Visit (INDEPENDENT_AMBULATORY_CARE_PROVIDER_SITE_OTHER): Payer: BC Managed Care – PPO | Admitting: *Deleted

## 2011-08-09 DIAGNOSIS — I4891 Unspecified atrial fibrillation: Secondary | ICD-10-CM

## 2011-08-09 DIAGNOSIS — Z7901 Long term (current) use of anticoagulants: Secondary | ICD-10-CM

## 2011-09-06 ENCOUNTER — Encounter: Payer: BC Managed Care – PPO | Admitting: *Deleted

## 2011-09-13 ENCOUNTER — Ambulatory Visit (INDEPENDENT_AMBULATORY_CARE_PROVIDER_SITE_OTHER): Payer: BC Managed Care – PPO | Admitting: *Deleted

## 2011-09-13 ENCOUNTER — Encounter: Payer: Self-pay | Admitting: Cardiology

## 2011-09-13 DIAGNOSIS — I4891 Unspecified atrial fibrillation: Secondary | ICD-10-CM

## 2011-09-13 DIAGNOSIS — Z7901 Long term (current) use of anticoagulants: Secondary | ICD-10-CM

## 2011-09-13 LAB — POCT INR: INR: 2.1

## 2011-09-14 ENCOUNTER — Ambulatory Visit: Payer: BC Managed Care – PPO | Admitting: Cardiology

## 2011-10-11 ENCOUNTER — Encounter: Payer: BC Managed Care – PPO | Admitting: *Deleted

## 2011-10-19 ENCOUNTER — Encounter: Payer: Self-pay | Admitting: Cardiology

## 2011-10-21 ENCOUNTER — Other Ambulatory Visit: Payer: Self-pay | Admitting: Cardiology

## 2011-10-25 ENCOUNTER — Ambulatory Visit: Payer: BC Managed Care – PPO | Admitting: Cardiology

## 2011-10-25 ENCOUNTER — Encounter: Payer: BC Managed Care – PPO | Admitting: *Deleted

## 2011-11-01 ENCOUNTER — Encounter: Payer: BC Managed Care – PPO | Admitting: *Deleted

## 2011-11-08 ENCOUNTER — Ambulatory Visit (INDEPENDENT_AMBULATORY_CARE_PROVIDER_SITE_OTHER): Payer: Medicare Other | Admitting: *Deleted

## 2011-11-08 DIAGNOSIS — I4891 Unspecified atrial fibrillation: Secondary | ICD-10-CM

## 2011-11-08 DIAGNOSIS — Z7901 Long term (current) use of anticoagulants: Secondary | ICD-10-CM

## 2011-11-08 LAB — POCT INR: INR: 2.7

## 2011-11-11 ENCOUNTER — Encounter: Payer: Self-pay | Admitting: Cardiology

## 2011-11-25 ENCOUNTER — Encounter: Payer: Self-pay | Admitting: Cardiology

## 2011-11-25 ENCOUNTER — Ambulatory Visit (INDEPENDENT_AMBULATORY_CARE_PROVIDER_SITE_OTHER): Payer: Medicare Other | Admitting: Cardiology

## 2011-11-25 VITALS — BP 142/83 | HR 88 | Ht 70.0 in | Wt 285.0 lb

## 2011-11-25 DIAGNOSIS — I10 Essential (primary) hypertension: Secondary | ICD-10-CM | POA: Insufficient documentation

## 2011-11-25 DIAGNOSIS — I4891 Unspecified atrial fibrillation: Secondary | ICD-10-CM

## 2011-11-25 DIAGNOSIS — I2510 Atherosclerotic heart disease of native coronary artery without angina pectoris: Secondary | ICD-10-CM

## 2011-11-25 NOTE — Assessment & Plan Note (Signed)
Blood pressure mildly elevated today. We have discussed diet, weight loss, also sodium restriction. Continue followup with Dr. Willaim Bane.

## 2011-11-25 NOTE — Assessment & Plan Note (Signed)
Permanent atrial fibrillation, continue strategy of heart rate control and anticoagulation.

## 2011-11-25 NOTE — Progress Notes (Signed)
Clinical Summary Mr. Kirschenmann is a 65 y.o.male presents for followup.  He has not been seen since last March. He states that he has been feeling fairly well, just a recent cold. No chest pain, progressive shortness of breath, or palpitations.  Recent INR 2.7. No reported bleeding problems on Coumadin, tolerating it well. He has had no focal motor weakness or speech problems.  Continues to follow up with Dr. Willaim Bane. He plans to have his most recent lab work forwarded to our office.   No Known Allergies  Medication list reviewed.  Past Medical History  Diagnosis Date  . Atrial fibrillation   . Coronary atherosclerosis of native coronary artery     Nonobstructive, LVEF normal  . Atrial flutter     RFA 2004  . Tachycardia induced cardiomyopathy     Resolved  . OSA (obstructive sleep apnea)   . Type 2 diabetes mellitus   . Essential hypertension, benign   . Bell's palsy     Social History Mr. Bordonaro reports that he has never smoked. He has never used smokeless tobacco. Mr. Ambroise reports that he does not drink alcohol.  Review of Systems Negative except as outlined.  Physical Examination Filed Vitals:   11/25/11 0839  BP: 142/83  Pulse: 88   Obese male in no acute distress.  HEENT: Conjunctiva and lids normal, oropharynx clear.  Neck: Supple, no elevated jugular venous pressure or bruits.  Lungs: Clear to auscultation, and nonlabored.  Cardiac: Irregularly irregular, no S3.  Abdomen: Soft, nontender, bowel sounds present.  Skin: Warm and dry.  Extremities: No pitting edema.   ECG Reviewed in EMR.  Problem List and Plan

## 2011-11-25 NOTE — Patient Instructions (Signed)
Your physician you to follow up in 1 year. You will receive a reminder letter in the mail one-two months in advance. If you don't receive a letter, please call our office to schedule the follow-up appointment. Your physician recommends that you continue on your current medications as directed. Please refer to the Current Medication list given to you today. 

## 2011-11-25 NOTE — Assessment & Plan Note (Signed)
No active angina, nonobstructive by prior assessment.

## 2011-12-09 ENCOUNTER — Telehealth: Payer: Self-pay | Admitting: *Deleted

## 2011-12-09 NOTE — Telephone Encounter (Signed)
Left message to call back on voicemail regarding results.  

## 2011-12-09 NOTE — Telephone Encounter (Signed)
Message copied by Arlyss Gandy on Fri Dec 09, 2011 12:54 PM ------      Message from: Jonelle Sidle      Created: Fri Dec 09, 2011  9:52 AM       Reviewed labs from Dr. Hall Busing office. Diabetes needs better control, hemoglobin A1c should be under 7. Also LDL is 149. Would recommend that he follow up with Dr. Willaim Bane to consider modification of diabetic regimen, also consider statin therapy to reduce LDL under 100 with concurrent diabetes and CAD.

## 2011-12-10 ENCOUNTER — Other Ambulatory Visit: Payer: Self-pay | Admitting: Cardiology

## 2011-12-12 ENCOUNTER — Telehealth: Payer: Self-pay | Admitting: *Deleted

## 2011-12-12 NOTE — Telephone Encounter (Signed)
Left message on patient's machine to call office/ Need to know if patient has been on lisinopril since during OV, lisinopril was removed from his list and request came to office today.

## 2011-12-13 NOTE — Telephone Encounter (Signed)
Pt notified and verbalized understanding. A copy of Dr. Ival Bible note will be faxed to Dr. Willaim Bane.

## 2011-12-13 NOTE — Telephone Encounter (Signed)
Patient informed nurse that he has been on lisinopril and just didn't realize it wasn't on his list when he came to office visit.

## 2011-12-20 ENCOUNTER — Encounter: Payer: BC Managed Care – PPO | Admitting: *Deleted

## 2012-03-05 ENCOUNTER — Other Ambulatory Visit: Payer: Self-pay | Admitting: Cardiology

## 2012-03-05 NOTE — Telephone Encounter (Signed)
Non-compliant pt...Marland Kitchenhow many refills?

## 2012-06-11 ENCOUNTER — Other Ambulatory Visit: Payer: Self-pay | Admitting: Cardiology

## 2012-06-11 MED ORDER — LISINOPRIL 10 MG PO TABS: 10.0000 mg | ORAL_TABLET | Freq: Every day | ORAL | Status: DC

## 2012-06-11 MED ORDER — METOPROLOL SUCCINATE ER 50 MG PO TB24: 50.0000 mg | ORAL_TABLET | Freq: Every day | ORAL | Status: DC

## 2012-09-26 ENCOUNTER — Encounter: Payer: Self-pay | Admitting: *Deleted

## 2012-12-03 ENCOUNTER — Telehealth: Payer: Self-pay | Admitting: Cardiology

## 2012-12-03 NOTE — Telephone Encounter (Signed)
Scheduled appt.

## 2012-12-03 NOTE — Telephone Encounter (Signed)
Left message for patient to call and schedule appointment from Recall  ° °

## 2012-12-13 ENCOUNTER — Ambulatory Visit: Payer: BC Managed Care – PPO | Admitting: Physician Assistant

## 2012-12-20 ENCOUNTER — Ambulatory Visit: Payer: BC Managed Care – PPO | Admitting: Physician Assistant

## 2012-12-28 ENCOUNTER — Ambulatory Visit (INDEPENDENT_AMBULATORY_CARE_PROVIDER_SITE_OTHER): Payer: BC Managed Care – PPO | Admitting: *Deleted

## 2012-12-28 ENCOUNTER — Ambulatory Visit (INDEPENDENT_AMBULATORY_CARE_PROVIDER_SITE_OTHER): Payer: BC Managed Care – PPO | Admitting: Physician Assistant

## 2012-12-28 ENCOUNTER — Encounter: Payer: Self-pay | Admitting: Physician Assistant

## 2012-12-28 VITALS — BP 171/96 | HR 102 | Ht 71.0 in | Wt 279.0 lb

## 2012-12-28 DIAGNOSIS — I4891 Unspecified atrial fibrillation: Secondary | ICD-10-CM

## 2012-12-28 DIAGNOSIS — I340 Nonrheumatic mitral (valve) insufficiency: Secondary | ICD-10-CM | POA: Insufficient documentation

## 2012-12-28 DIAGNOSIS — I2510 Atherosclerotic heart disease of native coronary artery without angina pectoris: Secondary | ICD-10-CM

## 2012-12-28 DIAGNOSIS — E785 Hyperlipidemia, unspecified: Secondary | ICD-10-CM | POA: Insufficient documentation

## 2012-12-28 DIAGNOSIS — Z7901 Long term (current) use of anticoagulants: Secondary | ICD-10-CM

## 2012-12-28 DIAGNOSIS — I10 Essential (primary) hypertension: Secondary | ICD-10-CM

## 2012-12-28 DIAGNOSIS — R000 Tachycardia, unspecified: Secondary | ICD-10-CM

## 2012-12-28 DIAGNOSIS — I43 Cardiomyopathy in diseases classified elsewhere: Secondary | ICD-10-CM | POA: Insufficient documentation

## 2012-12-28 DIAGNOSIS — I059 Rheumatic mitral valve disease, unspecified: Secondary | ICD-10-CM

## 2012-12-28 LAB — POCT INR: INR: 0.9

## 2012-12-28 MED ORDER — ATORVASTATIN CALCIUM 40 MG PO TABS: 40.0000 mg | ORAL_TABLET | Freq: Every evening | ORAL | Status: AC

## 2012-12-28 MED ORDER — METOPROLOL SUCCINATE ER 50 MG PO TB24: 50.0000 mg | ORAL_TABLET | Freq: Two times a day (BID) | ORAL | Status: DC

## 2012-12-28 NOTE — Assessment & Plan Note (Addendum)
Resolved by subsequent cardiac catheterization and echocardiography

## 2012-12-28 NOTE — Patient Instructions (Addendum)
   Increase Toprol to 50mg  twice a day  (new sent to pharm)  INR check today - may stop Aspirin once INR greater than 2.0  Begin Lipitor 40mg  every evening (new sent to pharm)  Labs - due in 3 months for cholesterol labs - will mail order when time  Continue all other current medications. Your physician wants you to follow up in: 6 months.  You will receive a reminder letter in the mail one-two months in advance.  If you don't receive a letter, please call our office to schedule the follow up appointment

## 2012-12-28 NOTE — Assessment & Plan Note (Signed)
Quiescent on current medication regimen. Will DC ASA, once INR reaches 2.0.

## 2012-12-28 NOTE — Assessment & Plan Note (Signed)
Will increase Toprol to 50 twice a day, for better rate control. Patient would like to continue following with our Coumadin clinic, and we will check an INR level today. Recommend discontinuing ASA, once INR reaches 2.0.

## 2012-12-28 NOTE — Assessment & Plan Note (Signed)
Currently uncontrolled. Would reassess following today's up titration of Toprol. Patient also scheduled to follow with his primary M.D. in the next few weeks, at which time further recommendations can be made.

## 2012-12-28 NOTE — Assessment & Plan Note (Signed)
Currently not on a statin. We'll initiate statin therapy with Lipitor 40 mg daily, and reassess lipid status in 12 weeks. Recommended LDL target 100 or less, if feasible.

## 2012-12-28 NOTE — Progress Notes (Signed)
Primary Cardiologist: Simona Huh, MD   HPI: Patient presents for annual followup, last seen here in December 2012, by Dr. Diona Browner.  He reports no interim development of exertional CP or significant DOE. He denies orthopnea, PND, or peripheral edema. He does have occasional palpitations.  Of note, he believes that he has not taken warfarin for "quite some time". Records indicate that it was last checked here at time of last OV, December 2012. He otherwise reports compliance with his remaining medications, as well as scheduled followup visits with his primary M.D., whom he is scheduled to see him the following 2 weeks.   12-lead EKG today, reviewed by me, indicates AF 102 bpm; NSST changes  No Known Allergies  Current Outpatient Prescriptions  Medication Sig Dispense Refill  . aspirin EC 81 MG tablet Take 81 mg by mouth daily.        . AZOR 10-40 MG per tablet Take 1 tablet by mouth Daily.      . hydrochlorothiazide 25 MG tablet Take 25 mg by mouth daily.        Marland Kitchen LANTUS 100 UNIT/ML injection Inject 70 Units into the skin Daily.      Marland Kitchen lisinopril (PRINIVIL,ZESTRIL) 10 MG tablet Take 1 tablet (10 mg total) by mouth daily.  90 tablet  3  . metFORMIN (GLUCOPHAGE) 1000 MG tablet Take 1 tablet by mouth Daily.      . metoprolol succinate (TOPROL-XL) 50 MG 24 hr tablet Take 1 tablet (50 mg total) by mouth 2 (two) times daily.  180 tablet  3  . potassium chloride (KLOR-CON) 10 MEQ CR tablet Take 10 mEq by mouth daily.        Marland Kitchen warfarin (COUMADIN) 5 MG tablet TAKE ONE TABLET BY MOUTH AS DIRECTED  90 tablet  3    Past Medical History  Diagnosis Date  . Atrial fibrillation   . Coronary atherosclerosis of native coronary artery     Nonobstructive, LVEF normal  . Atrial flutter     RFA 2004  . Tachycardia induced cardiomyopathy     Resolved  . OSA (obstructive sleep apnea)   . Type 2 diabetes mellitus   . Essential hypertension, benign   . Bell's palsy   . Mitral regurgitation    Resolved    No past surgical history on file.  History   Social History  . Marital Status: Married    Spouse Name: N/A    Number of Children: N/A  . Years of Education: N/A   Occupational History  . Not on file.   Social History Main Topics  . Smoking status: Never Smoker   . Smokeless tobacco: Never Used  . Alcohol Use: No  . Drug Use: No  . Sexually Active: Not on file   Other Topics Concern  . Not on file   Social History Narrative  . No narrative on file   Social History Narrative  . No narrative on file    Problem Relation Age of Onset  . Diabetes type II Mother   . COPD Father     ROS: no nausea, vomiting; no fever, chills; no melena, hematochezia; no claudication  PHYSICAL EXAM: BP 171/96  Pulse 102  Ht 5\' 11"  (1.803 m)  Wt 279 lb (126.554 kg)  BMI 38.91 kg/m2 GENERAL: 67 year-old male, obese; NAD HEENT: NCAT, PERRLA, EOMI; sclera clear; no xanthelasma NECK: No carotid bruits; unable to assess JVD, secondary to neck girth LUNGS: CTA bilaterally CARDIAC: RRR (S1, S2); no significant  murmurs; no rubs or gallops ABDOMEN: Protuberant  EXTREMETIES: Trace peripheral edema SKIN: warm/dry; no obvious rash/lesions MUSCULOSKELETAL: no joint deformity NEURO: no focal deficit; NL affect   EKG: reviewed and available in Electronic Records   ASSESSMENT & PLAN:  ATRIAL FIBRILLATION Will increase Toprol to 50 twice a day, for better rate control. Patient would like to continue following with our Coumadin clinic, and we will check an INR level today. Recommend discontinuing ASA, once INR reaches 2.0.  Coronary atherosclerosis of native coronary artery Quiescent on current medication regimen. Will DC ASA, once INR reaches 2.0.  Essential hypertension, benign Currently uncontrolled. Would reassess following today's up titration of Toprol. Patient also scheduled to follow with his primary M.D. in the next few weeks, at which time further recommendations can be  made.  HLD (hyperlipidemia) Currently not on a statin. We'll initiate statin therapy with Lipitor 40 mg daily, and reassess lipid status in 12 weeks. Recommended LDL target 100 or less, if feasible.  Mitral regurgitation No further workup currently indicated. Initially assessed as 3-4+ MR, by cardiac catheterization in 2004, at which time EF 25% (presumed tachycardia mediated). No evidence of MR, by echocardiogram in 2008 (EF 60-65%)  Tachycardia induced cardiomyopathy Resolved by subsequent cardiac catheterization and echocardiography    Gene Tom Macpherson, PAC

## 2012-12-28 NOTE — Assessment & Plan Note (Signed)
No further workup currently indicated. Initially assessed as 3-4+ MR, by cardiac catheterization in 2004, at which time EF 25% (presumed tachycardia mediated). No evidence of MR, by echocardiogram in 2008 (EF 60-65%)

## 2013-01-08 ENCOUNTER — Ambulatory Visit (INDEPENDENT_AMBULATORY_CARE_PROVIDER_SITE_OTHER): Payer: BC Managed Care – PPO | Admitting: *Deleted

## 2013-01-08 DIAGNOSIS — Z5181 Encounter for therapeutic drug level monitoring: Secondary | ICD-10-CM

## 2013-01-08 DIAGNOSIS — Z7901 Long term (current) use of anticoagulants: Secondary | ICD-10-CM

## 2013-01-08 DIAGNOSIS — I4891 Unspecified atrial fibrillation: Secondary | ICD-10-CM

## 2013-01-08 LAB — POCT INR: INR: 1.7

## 2013-01-29 ENCOUNTER — Ambulatory Visit (INDEPENDENT_AMBULATORY_CARE_PROVIDER_SITE_OTHER): Payer: BC Managed Care – PPO | Admitting: *Deleted

## 2013-01-29 DIAGNOSIS — I4891 Unspecified atrial fibrillation: Secondary | ICD-10-CM

## 2013-01-29 DIAGNOSIS — Z7901 Long term (current) use of anticoagulants: Secondary | ICD-10-CM

## 2013-02-26 ENCOUNTER — Ambulatory Visit (INDEPENDENT_AMBULATORY_CARE_PROVIDER_SITE_OTHER): Payer: BC Managed Care – PPO | Admitting: *Deleted

## 2013-02-26 DIAGNOSIS — Z7901 Long term (current) use of anticoagulants: Secondary | ICD-10-CM

## 2013-02-26 DIAGNOSIS — I4891 Unspecified atrial fibrillation: Secondary | ICD-10-CM

## 2013-03-05 ENCOUNTER — Ambulatory Visit (INDEPENDENT_AMBULATORY_CARE_PROVIDER_SITE_OTHER): Payer: BC Managed Care – PPO | Admitting: *Deleted

## 2013-03-05 DIAGNOSIS — Z7901 Long term (current) use of anticoagulants: Secondary | ICD-10-CM

## 2013-03-05 DIAGNOSIS — I4891 Unspecified atrial fibrillation: Secondary | ICD-10-CM

## 2013-03-05 LAB — POCT INR: INR: 4

## 2013-03-19 ENCOUNTER — Ambulatory Visit (INDEPENDENT_AMBULATORY_CARE_PROVIDER_SITE_OTHER): Payer: BC Managed Care – PPO | Admitting: *Deleted

## 2013-03-19 DIAGNOSIS — I4891 Unspecified atrial fibrillation: Secondary | ICD-10-CM

## 2013-03-19 DIAGNOSIS — Z7901 Long term (current) use of anticoagulants: Secondary | ICD-10-CM

## 2013-03-19 LAB — POCT INR: INR: 1.8

## 2013-03-29 ENCOUNTER — Encounter: Payer: Self-pay | Admitting: *Deleted

## 2013-03-29 ENCOUNTER — Telehealth: Payer: Self-pay | Admitting: *Deleted

## 2013-03-29 DIAGNOSIS — I2510 Atherosclerotic heart disease of native coronary artery without angina pectoris: Secondary | ICD-10-CM

## 2013-03-29 DIAGNOSIS — E785 Hyperlipidemia, unspecified: Secondary | ICD-10-CM

## 2013-03-29 NOTE — Telephone Encounter (Signed)
Mailed orders and letter today.

## 2013-03-29 NOTE — Telephone Encounter (Signed)
Message copied by Lesle Chris on Fri Mar 29, 2013 10:14 AM ------      Message from: Lesle Chris      Created: Fri Dec 28, 2012  2:47 PM       FLP / LFT  ------

## 2013-04-02 ENCOUNTER — Ambulatory Visit (INDEPENDENT_AMBULATORY_CARE_PROVIDER_SITE_OTHER): Payer: BC Managed Care – PPO | Admitting: *Deleted

## 2013-04-02 DIAGNOSIS — I4891 Unspecified atrial fibrillation: Secondary | ICD-10-CM

## 2013-04-02 DIAGNOSIS — Z7901 Long term (current) use of anticoagulants: Secondary | ICD-10-CM

## 2013-04-02 LAB — POCT INR: INR: 4.5

## 2013-04-05 ENCOUNTER — Other Ambulatory Visit: Payer: Self-pay | Admitting: Cardiology

## 2013-04-16 ENCOUNTER — Ambulatory Visit (INDEPENDENT_AMBULATORY_CARE_PROVIDER_SITE_OTHER): Payer: BC Managed Care – PPO | Admitting: *Deleted

## 2013-04-16 DIAGNOSIS — I4891 Unspecified atrial fibrillation: Secondary | ICD-10-CM

## 2013-04-16 DIAGNOSIS — Z7901 Long term (current) use of anticoagulants: Secondary | ICD-10-CM

## 2013-04-16 LAB — POCT INR: INR: 4.4

## 2013-04-30 ENCOUNTER — Telehealth: Payer: Self-pay | Admitting: *Deleted

## 2013-04-30 ENCOUNTER — Ambulatory Visit (INDEPENDENT_AMBULATORY_CARE_PROVIDER_SITE_OTHER): Payer: BC Managed Care – PPO | Admitting: *Deleted

## 2013-04-30 DIAGNOSIS — Z7901 Long term (current) use of anticoagulants: Secondary | ICD-10-CM

## 2013-04-30 DIAGNOSIS — I4891 Unspecified atrial fibrillation: Secondary | ICD-10-CM

## 2013-04-30 LAB — POCT INR: INR: 2.5

## 2013-04-30 NOTE — Telephone Encounter (Signed)
Message copied by Carmela Hurt on Tue Apr 30, 2013  4:32 PM ------      Message from: Jonelle Sidle      Created: Tue Apr 30, 2013  4:25 PM      Regarding: RE: tooth extraction 05/07/2013       No need to bridge.            ----- Message -----         From: Carmela Hurt, RN         Sent: 04/30/2013   4:06 PM           To: Louanna Raw, RN, Jonelle Sidle, MD, #      Subject: tooth extraction 05/07/2013                                Previous tooth extraction by same dentist in [place], [place], he wants coumadin held 3 days prior to extraction, do we need to bridge?            Thanks, kim       ------

## 2013-04-30 NOTE — Telephone Encounter (Signed)
Have called and left 2 messages, pt to take last dose coumadin on Friday 05/03/2013, procedure 05/07/2013, tooth extraction, have ask him to call us back and let us know he got this message, this has been cleared through cardiologist.

## 2013-05-01 ENCOUNTER — Telehealth: Payer: Self-pay | Admitting: *Deleted

## 2013-05-01 NOTE — Telephone Encounter (Signed)
Have called and left messages for pt to call regarding when to start holding coumadin, have not gotten a call back yet.

## 2013-05-01 NOTE — Telephone Encounter (Signed)
Instructed to take last dose coumadin Friday  5/30, when he is instructed to start coumadin back take an extra 1/2 tablet for 2 days, pt states he understands instructions

## 2013-05-24 ENCOUNTER — Ambulatory Visit (INDEPENDENT_AMBULATORY_CARE_PROVIDER_SITE_OTHER): Payer: Medicare PPO | Admitting: *Deleted

## 2013-05-24 DIAGNOSIS — I4891 Unspecified atrial fibrillation: Secondary | ICD-10-CM

## 2013-05-24 DIAGNOSIS — Z7901 Long term (current) use of anticoagulants: Secondary | ICD-10-CM

## 2013-05-26 ENCOUNTER — Other Ambulatory Visit: Payer: Self-pay | Admitting: Cardiology

## 2013-05-27 ENCOUNTER — Other Ambulatory Visit: Payer: Self-pay | Admitting: Cardiology

## 2013-05-27 NOTE — Telephone Encounter (Signed)
Medication sent via escribe.  

## 2013-09-10 ENCOUNTER — Other Ambulatory Visit: Payer: Self-pay | Admitting: *Deleted

## 2013-09-10 ENCOUNTER — Other Ambulatory Visit: Payer: Self-pay | Admitting: Cardiology

## 2013-09-10 MED ORDER — WARFARIN SODIUM 5 MG PO TABS: 10.0000 mg | ORAL_TABLET | Freq: Every day | ORAL | Status: DC

## 2013-09-10 NOTE — Telephone Encounter (Signed)
refill 

## 2013-11-11 ENCOUNTER — Other Ambulatory Visit: Payer: Self-pay | Admitting: *Deleted

## 2013-11-11 MED ORDER — LISINOPRIL 10 MG PO TABS: 10.0000 mg | ORAL_TABLET | Freq: Every day | ORAL | Status: DC

## 2013-11-25 ENCOUNTER — Other Ambulatory Visit: Payer: Self-pay | Admitting: Cardiology

## 2013-11-25 MED ORDER — LISINOPRIL 10 MG PO TABS: 10.0000 mg | ORAL_TABLET | Freq: Every day | ORAL | Status: DC

## 2013-11-25 MED ORDER — WARFARIN SODIUM 5 MG PO TABS: 10.0000 mg | ORAL_TABLET | Freq: Every day | ORAL | Status: DC

## 2013-11-29 ENCOUNTER — Other Ambulatory Visit: Payer: Self-pay | Admitting: Cardiology

## 2013-11-29 MED ORDER — LISINOPRIL 10 MG PO TABS: 10.0000 mg | ORAL_TABLET | Freq: Every day | ORAL | Status: DC

## 2014-02-14 ENCOUNTER — Telehealth: Payer: Self-pay | Admitting: Cardiology

## 2014-02-14 MED ORDER — WARFARIN SODIUM 5 MG PO TABS: 10.0000 mg | ORAL_TABLET | Freq: Every day | ORAL | Status: DC

## 2014-02-14 NOTE — Telephone Encounter (Signed)
Called and left message for pt to return call to schedule an appointment with MD for future medication refills

## 2014-04-09 ENCOUNTER — Other Ambulatory Visit: Payer: Self-pay | Admitting: *Deleted

## 2014-04-09 MED ORDER — LISINOPRIL 10 MG PO TABS: 10.0000 mg | ORAL_TABLET | Freq: Every day | ORAL | Status: DC

## 2014-04-22 ENCOUNTER — Encounter: Payer: Self-pay | Admitting: Cardiology

## 2014-04-22 ENCOUNTER — Encounter: Payer: Medicare PPO | Admitting: Cardiology

## 2014-04-22 NOTE — Progress Notes (Signed)
No show  This encounter was created in error - please disregard.

## 2014-05-22 ENCOUNTER — Ambulatory Visit (INDEPENDENT_AMBULATORY_CARE_PROVIDER_SITE_OTHER): Payer: Medicare PPO | Admitting: Cardiology

## 2014-05-22 ENCOUNTER — Ambulatory Visit (INDEPENDENT_AMBULATORY_CARE_PROVIDER_SITE_OTHER): Payer: Medicare PPO | Admitting: *Deleted

## 2014-05-22 ENCOUNTER — Encounter: Payer: Self-pay | Admitting: Cardiology

## 2014-05-22 VITALS — BP 147/88 | HR 88 | Ht 71.0 in | Wt 273.4 lb

## 2014-05-22 DIAGNOSIS — I429 Cardiomyopathy, unspecified: Secondary | ICD-10-CM

## 2014-05-22 DIAGNOSIS — I4891 Unspecified atrial fibrillation: Secondary | ICD-10-CM

## 2014-05-22 DIAGNOSIS — Z7901 Long term (current) use of anticoagulants: Secondary | ICD-10-CM

## 2014-05-22 DIAGNOSIS — E785 Hyperlipidemia, unspecified: Secondary | ICD-10-CM

## 2014-05-22 DIAGNOSIS — I2510 Atherosclerotic heart disease of native coronary artery without angina pectoris: Secondary | ICD-10-CM

## 2014-05-22 DIAGNOSIS — I482 Chronic atrial fibrillation, unspecified: Secondary | ICD-10-CM

## 2014-05-22 LAB — POCT INR: INR: 1

## 2014-05-22 NOTE — Assessment & Plan Note (Signed)
Nonobstructive disease documented prior cardiac catheterization 2006. No angina symptoms.

## 2014-05-22 NOTE — Assessment & Plan Note (Signed)
No significant mitral regurgitation described by last echocardiogram in 2008.

## 2014-05-22 NOTE — Assessment & Plan Note (Signed)
Prior history of tachycardia mediated cardiomyopathy associated with significant mitral regurgitation. Last echocardiogram was in 2008. Followup study will be arranged.

## 2014-05-22 NOTE — Patient Instructions (Addendum)
Your physician recommends that you schedule a follow-up appointment in: 6 months. You will receive a reminder letter in the mail in about 4 months reminding you to call and schedule your appointment. If you don't receive this letter, please contact our office. Your physician recommends that you continue on your current medications as directed. Please refer to the Current Medication list given to you today. Take your warfarin 2 tablets per day. Your physician has requested that you have an echocardiogram. Echocardiography is a painless test that uses sound waves to create images of your heart. It provides your doctor with information about the size and shape of your heart and how well your heart's chambers and valves are working. This procedure takes approximately one hour. There are no restrictions for this procedure. You need to keep your regular scheduled appointments with Misty StanleyLisa in the coumadin clinic.

## 2014-05-22 NOTE — Progress Notes (Signed)
Clinical Summary [name] is a 68 y.o.male last seen by Mr. Serpe PA-C in January 2014. He is not maintained regular followup with since then. He presents today reporting NYHA class 2-3 shortness of breath, intermittent palpitations, but no chest pain. He states that he sees Dr. Willaim [name] about every 3 months.  No recent followup in the Coumadin clinic noted. He denies any bleeding problems, has continued on Coumadin.  Echocardiogram from 2008 reported mild LVH with LVEF 60-65%, diastolic dysfunction, moderate left atrial enlargement, mildly sclerotic aortic valve, no significant mitral regurgitation, no pericardial effusion.  Today we discussed his medications, and the critical importance of regular followup particularly on Coumadin.  ECG today shows rate-controlled atrial fibrillation with low voltage, left anterior fascicular block, poor R wave progression, nonspecific T-wave changes.   No Known Allergies  Current Outpatient Prescriptions  Medication Sig Dispense Refill  . aspirin EC 81 MG tablet Take 81 mg by mouth daily.        Marland Kitchen. atorvastatin (LIPITOR) 40 MG tablet Take 1 tablet (40 mg total) by mouth every evening.  30 tablet  6  . AZOR 10-40 MG per tablet Take 1 tablet by mouth Daily.      . hydrochlorothiazide 25 MG tablet Take 25 mg by mouth daily.        Marland Kitchen. LANTUS 100 UNIT/ML injection Inject 70 Units into the skin Daily.      Marland Kitchen. lisinopril (PRINIVIL,ZESTRIL) 10 MG tablet Take 1 tablet (10 mg total) by mouth daily.  14 tablet  0  . metFORMIN (GLUCOPHAGE) 1000 MG tablet Take 1 tablet by mouth Daily.      . metoprolol succinate (TOPROL-XL) 50 MG 24 hr tablet Take 1 tablet (50 mg total) by mouth 2 (two) times daily.  180 tablet  3  . potassium chloride (KLOR-CON) 10 MEQ CR tablet Take 10 mEq by mouth daily.        Marland Kitchen. warfarin (COUMADIN) 5 MG tablet Take 15 mg by mouth every other day.       No current facility-administered medications for this visit.    Past Medical History    Diagnosis Date  . Atrial fibrillation   . Coronary atherosclerosis of native coronary artery     Nonobstructive, LVEF normal  . Atrial flutter     RFA 2004  . Tachycardia induced cardiomyopathy     Resolved  . OSA (obstructive sleep apnea)   . Type 2 diabetes mellitus   . Essential hypertension, benign   . Bell's palsy   . Mitral regurgitation     Social History [name] reports that he has never smoked. He has never used smokeless tobacco. [name] reports that he does not drink alcohol.  Review of Systems No leg edema, orthopnea or PND. No angina symptoms. No claudication. Other systems reviewed and negative.  Physical Examination Filed Vitals:   05/22/14 1428  BP: 147/88  Pulse: 88   Filed Weights   05/22/14 1428  Weight: 273 lb 6.4 oz (124.013 kg)   Obese male, no distress. HEENT: Conjunctiva and lids normal, oropharynx clear. Neck: Supple, increased girth, no obvious elevated JVP or carotid bruits, no thyromegaly. Lungs: Clear to auscultation, nonlabored breathing at rest. Cardiac: Irregularly irregular, no S3, 2/6 systolic murmur, no pericardial rub. Abdomen: Soft, nontender, protuberant, bowel sounds present. Extremities: Trace ankle edema, distal pulses 2+. Skin: Warm and dry. Musculoskeletal: No kyphosis. Neuropsychiatric: Alert and oriented x3, affect grossly appropriate.   Problem List and Plan  Atrial fibrillation Permanent. Plan to treat with rate control and anticoagulation strategy. I reinforced with him the critical importance of following his PT/INR levels. If Dr. Willaim [name] can check this, this would certainly be reasonable, otherwise will need to see him in the Coumadin clinic. Alternatively could discuss an NOAC. Will check PT/INR today and forward to Coumadin clinic.  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 Last echocardiogram was in 2008. Followup study will  be arranged.  Mitral regurgitation No significant mitral regurgitation described by last echocardiogram in 2008.  Coronary atherosclerosis of native coronary artery Nonobstructive disease documented prior cardiac catheterization 2006. No angina symptoms.  HLD (hyperlipidemia) Continues on Lipitor, followed by Dr. Willaim [name].    [name], M.D., F.A.C.C.

## 2014-05-22 NOTE — Assessment & Plan Note (Signed)
Continues on Lipitor, followed by Dr. Willaim BanePark.

## 2014-05-22 NOTE — Assessment & Plan Note (Signed)
Permanent. Plan to treat with rate control and anticoagulation strategy. I reinforced with him the critical importance of following his PT/INR levels. If Dr. Willaim BanePark can check this, this would certainly be reasonable, otherwise will need to see him in the Coumadin clinic. Alternatively could discuss an NOAC. Will check PT/INR today and forward to Coumadin clinic.

## 2014-05-26 ENCOUNTER — Other Ambulatory Visit: Payer: Self-pay | Admitting: *Deleted

## 2014-05-26 MED ORDER — WARFARIN SODIUM 5 MG PO TABS: 10.0000 mg | ORAL_TABLET | Freq: Every day | ORAL | Status: DC

## 2014-05-30 ENCOUNTER — Ambulatory Visit (INDEPENDENT_AMBULATORY_CARE_PROVIDER_SITE_OTHER): Payer: Medicare PPO | Admitting: *Deleted

## 2014-05-30 DIAGNOSIS — I4891 Unspecified atrial fibrillation: Secondary | ICD-10-CM

## 2014-05-30 DIAGNOSIS — I482 Chronic atrial fibrillation, unspecified: Secondary | ICD-10-CM

## 2014-05-30 DIAGNOSIS — Z7901 Long term (current) use of anticoagulants: Secondary | ICD-10-CM

## 2014-05-30 LAB — POCT INR: INR: 2

## 2014-06-04 ENCOUNTER — Other Ambulatory Visit: Payer: Self-pay

## 2014-06-04 ENCOUNTER — Other Ambulatory Visit (INDEPENDENT_AMBULATORY_CARE_PROVIDER_SITE_OTHER): Payer: Medicare PPO

## 2014-06-04 DIAGNOSIS — I429 Cardiomyopathy, unspecified: Secondary | ICD-10-CM

## 2014-06-04 DIAGNOSIS — I4891 Unspecified atrial fibrillation: Secondary | ICD-10-CM

## 2014-06-04 DIAGNOSIS — I359 Nonrheumatic aortic valve disorder, unspecified: Secondary | ICD-10-CM

## 2014-06-04 DIAGNOSIS — I482 Chronic atrial fibrillation, unspecified: Secondary | ICD-10-CM

## 2014-06-04 DIAGNOSIS — I2510 Atherosclerotic heart disease of native coronary artery without angina pectoris: Secondary | ICD-10-CM

## 2014-06-05 ENCOUNTER — Telehealth: Payer: Self-pay | Admitting: *Deleted

## 2014-06-05 NOTE — Telephone Encounter (Signed)
Patient informed via message machine. 

## 2014-06-05 NOTE — Telephone Encounter (Signed)
Message copied by Eustace MooreANDERSON, Zeshan Sena M on Thu Jun 05, 2014  4:08 PM ------      Message from: MCDOWELL, Illene BolusSAMUEL G      Created: Wed Jun 04, 2014  4:55 PM       Reviewed report. LVEF remains normal. Only mild mitral regurgitation and mild aortic stenosis - not symptomatic issues. Continue medical therapy. ------

## 2014-06-10 ENCOUNTER — Telehealth: Payer: Self-pay | Admitting: *Deleted

## 2014-06-10 NOTE — Telephone Encounter (Signed)
Patient returned call

## 2014-06-16 ENCOUNTER — Other Ambulatory Visit: Payer: Self-pay | Admitting: *Deleted

## 2014-06-16 MED ORDER — LISINOPRIL 10 MG PO TABS: 10.0000 mg | ORAL_TABLET | Freq: Every day | ORAL | Status: AC

## 2014-06-17 NOTE — Telephone Encounter (Signed)
Patient left a message on my voicemail. I tried to return his call but no answer

## 2014-06-18 NOTE — Telephone Encounter (Signed)
No answer at home.  I have not called pt

## 2014-06-20 ENCOUNTER — Ambulatory Visit (INDEPENDENT_AMBULATORY_CARE_PROVIDER_SITE_OTHER): Payer: Medicare PPO | Admitting: *Deleted

## 2014-06-20 DIAGNOSIS — I482 Chronic atrial fibrillation, unspecified: Secondary | ICD-10-CM

## 2014-06-20 DIAGNOSIS — Z7901 Long term (current) use of anticoagulants: Secondary | ICD-10-CM

## 2014-06-20 DIAGNOSIS — I4891 Unspecified atrial fibrillation: Secondary | ICD-10-CM

## 2014-06-20 LAB — POCT INR: INR: 2

## 2014-07-04 ENCOUNTER — Telehealth: Payer: Self-pay | Admitting: Cardiology

## 2014-07-04 MED ORDER — WARFARIN SODIUM 5 MG PO TABS: 10.0000 mg | ORAL_TABLET | Freq: Every day | ORAL | Status: DC

## 2014-07-04 NOTE — Telephone Encounter (Signed)
Received fax refill request  Rx # [id number] Medication:  Warfarin 5 mg tab Qty 60 Sig:  Take two tablets by mouth once daily Physician:  Diona BrownerMcdowell

## 2014-07-18 ENCOUNTER — Ambulatory Visit (INDEPENDENT_AMBULATORY_CARE_PROVIDER_SITE_OTHER): Payer: Medicare PPO | Admitting: *Deleted

## 2014-07-18 DIAGNOSIS — I4891 Unspecified atrial fibrillation: Secondary | ICD-10-CM

## 2014-07-18 DIAGNOSIS — I482 Chronic atrial fibrillation, unspecified: Secondary | ICD-10-CM

## 2014-07-18 DIAGNOSIS — Z7901 Long term (current) use of anticoagulants: Secondary | ICD-10-CM

## 2014-07-18 LAB — POCT INR: INR: 2.8

## 2014-08-05 ENCOUNTER — Telehealth: Payer: Self-pay | Admitting: Cardiology

## 2014-08-05 MED ORDER — METOPROLOL SUCCINATE ER 50 MG PO TB24: 50.0000 mg | ORAL_TABLET | Freq: Two times a day (BID) | ORAL | Status: AC

## 2014-08-05 NOTE — Telephone Encounter (Signed)
Received fax refill request  Rx # [id number] Medication:  Metoprolol ER 50 mg tab Qty 180 Sig:  Take one tablet by mouth twice daily Physician:  Diona Browner

## 2014-08-15 ENCOUNTER — Ambulatory Visit (INDEPENDENT_AMBULATORY_CARE_PROVIDER_SITE_OTHER): Payer: Medicare PPO | Admitting: *Deleted

## 2014-08-15 DIAGNOSIS — Z7901 Long term (current) use of anticoagulants: Secondary | ICD-10-CM

## 2014-08-15 DIAGNOSIS — I4891 Unspecified atrial fibrillation: Secondary | ICD-10-CM

## 2014-08-15 DIAGNOSIS — I482 Chronic atrial fibrillation, unspecified: Secondary | ICD-10-CM

## 2014-08-15 LAB — POCT INR: INR: 1.8

## 2014-09-12 ENCOUNTER — Ambulatory Visit (INDEPENDENT_AMBULATORY_CARE_PROVIDER_SITE_OTHER): Payer: Medicare PPO

## 2014-09-12 DIAGNOSIS — I482 Chronic atrial fibrillation, unspecified: Secondary | ICD-10-CM

## 2014-09-12 DIAGNOSIS — I4891 Unspecified atrial fibrillation: Secondary | ICD-10-CM

## 2014-09-12 DIAGNOSIS — Z7901 Long term (current) use of anticoagulants: Secondary | ICD-10-CM

## 2014-09-12 LAB — POCT INR: INR: 1.3

## 2014-09-18 ENCOUNTER — Ambulatory Visit (INDEPENDENT_AMBULATORY_CARE_PROVIDER_SITE_OTHER): Payer: Medicare PPO | Admitting: *Deleted

## 2014-09-18 DIAGNOSIS — Z7901 Long term (current) use of anticoagulants: Secondary | ICD-10-CM

## 2014-09-18 DIAGNOSIS — I4891 Unspecified atrial fibrillation: Secondary | ICD-10-CM

## 2014-09-18 DIAGNOSIS — I482 Chronic atrial fibrillation, unspecified: Secondary | ICD-10-CM

## 2014-09-18 LAB — POCT INR: INR: 2.3

## 2014-10-14 ENCOUNTER — Ambulatory Visit (INDEPENDENT_AMBULATORY_CARE_PROVIDER_SITE_OTHER): Payer: Medicare PPO | Admitting: *Deleted

## 2014-10-14 DIAGNOSIS — I4891 Unspecified atrial fibrillation: Secondary | ICD-10-CM

## 2014-10-14 DIAGNOSIS — Z7901 Long term (current) use of anticoagulants: Secondary | ICD-10-CM

## 2014-10-14 DIAGNOSIS — I482 Chronic atrial fibrillation, unspecified: Secondary | ICD-10-CM

## 2014-10-14 LAB — POCT INR: INR: 1.8

## 2014-12-01 ENCOUNTER — Telehealth: Payer: Self-pay | Admitting: Cardiology

## 2014-12-01 MED ORDER — WARFARIN SODIUM 5 MG PO TABS: 10.0000 mg | ORAL_TABLET | Freq: Every day | ORAL | Status: AC

## 2014-12-01 NOTE — Telephone Encounter (Signed)
Received fax refill request  Rx # [id number] Medication:  Warfarin 5 mg tab Qty 60 Sig:  Take two tablets by mouth once daily Physician:  Diona BrownerMcDowell

## 2015-08-12 ENCOUNTER — Other Ambulatory Visit (HOSPITAL_COMMUNITY): Payer: Self-pay

## 2015-08-12 ENCOUNTER — Inpatient Hospital Stay
Admission: AD | Admit: 2015-08-12 | Discharge: 2015-09-03 | Disposition: A | Discharge status: Skilled Nursing Facility | Payer: Self-pay | Source: Ambulatory Visit | Attending: Internal Medicine | Admitting: Internal Medicine

## 2015-08-12 DIAGNOSIS — Z9889 Other specified postprocedural states: Secondary | ICD-10-CM

## 2015-08-12 DIAGNOSIS — Z431 Encounter for attention to gastrostomy: Secondary | ICD-10-CM

## 2015-08-12 LAB — COMPREHENSIVE METABOLIC PANEL
ALT: 166 U/L — ABNORMAL HIGH (ref 17–63)
ANION GAP: 6 (ref 5–15)
AST: 117 U/L — ABNORMAL HIGH (ref 15–41)
Albumin: 2.3 g/dL — ABNORMAL LOW (ref 3.5–5.0)
Alkaline Phosphatase: 310 U/L — ABNORMAL HIGH (ref 38–126)
BILIRUBIN TOTAL: 0.8 mg/dL (ref 0.3–1.2)
BUN: 21 mg/dL — ABNORMAL HIGH (ref 6–20)
CO2: 34 mmol/L — ABNORMAL HIGH (ref 22–32)
Calcium: 9.1 mg/dL (ref 8.9–10.3)
Chloride: 98 mmol/L — ABNORMAL LOW (ref 101–111)
Creatinine, Ser: 0.7 mg/dL (ref 0.61–1.24)
Glucose, Bld: 122 mg/dL — ABNORMAL HIGH (ref 65–99)
POTASSIUM: 4.7 mmol/L (ref 3.5–5.1)
Sodium: 138 mmol/L (ref 135–145)
TOTAL PROTEIN: 7.4 g/dL (ref 6.5–8.1)

## 2015-08-13 ENCOUNTER — Other Ambulatory Visit (HOSPITAL_COMMUNITY): Payer: Self-pay

## 2015-08-13 LAB — CBC WITH DIFFERENTIAL/PLATELET
Basophils Absolute: 0 10*3/uL (ref 0.0–0.1)
Basophils Relative: 0 % (ref 0–1)
Eosinophils Absolute: 0.8 10*3/uL — ABNORMAL HIGH (ref 0.0–0.7)
Eosinophils Relative: 9 % — ABNORMAL HIGH (ref 0–5)
HEMATOCRIT: 45.3 % (ref 39.0–52.0)
HEMOGLOBIN: 14.2 g/dL (ref 13.0–17.0)
LYMPHS ABS: 1.8 10*3/uL (ref 0.7–4.0)
LYMPHS PCT: 19 % (ref 12–46)
MCH: 28.4 pg (ref 26.0–34.0)
MCHC: 31.3 g/dL (ref 30.0–36.0)
MCV: 90.6 fL (ref 78.0–100.0)
MONOS PCT: 10 % (ref 3–12)
Monocytes Absolute: 0.9 10*3/uL (ref 0.1–1.0)
NEUTROS ABS: 5.5 10*3/uL (ref 1.7–7.7)
NEUTROS PCT: 62 % (ref 43–77)
Platelets: 299 10*3/uL (ref 150–400)
RBC: 5 MIL/uL (ref 4.22–5.81)
RDW: 12.7 % (ref 11.5–15.5)
WBC: 9.1 10*3/uL (ref 4.0–10.5)

## 2015-08-13 LAB — TSH: TSH: 1.311 u[IU]/mL (ref 0.350–4.500)

## 2015-08-13 LAB — VITAMIN B12: Vitamin B-12: 1169 pg/mL — ABNORMAL HIGH (ref 180–914)

## 2015-08-13 LAB — PHOSPHORUS: PHOSPHORUS: 4.6 mg/dL (ref 2.5–4.6)

## 2015-08-13 LAB — MAGNESIUM: Magnesium: 1.7 mg/dL (ref 1.7–2.4)

## 2015-08-13 LAB — PROTIME-INR
INR: 1.17 (ref 0.00–1.49)
PROTHROMBIN TIME: 15.1 s (ref 11.6–15.2)

## 2015-08-13 LAB — T4, FREE: FREE T4: 1.07 ng/dL (ref 0.61–1.12)

## 2015-08-14 LAB — FOLATE RBC
FOLATE, RBC: 1019 ng/mL (ref >498)
Folate, Hemolysate: 453.4 ng/mL
HEMATOCRIT: 44.5 % (ref 37.5–51.0)

## 2015-08-14 LAB — COMPREHENSIVE METABOLIC PANEL
ALBUMIN: 2.3 g/dL — AB (ref 3.5–5.0)
ALK PHOS: 263 U/L — AB (ref 38–126)
ALT: 123 U/L — AB (ref 17–63)
AST: 91 U/L — AB (ref 15–41)
Anion gap: 9 (ref 5–15)
BILIRUBIN TOTAL: 1.4 mg/dL — AB (ref 0.3–1.2)
BUN: 21 mg/dL — AB (ref 6–20)
CALCIUM: 8.8 mg/dL — AB (ref 8.9–10.3)
CO2: 31 mmol/L (ref 22–32)
Chloride: 92 mmol/L — ABNORMAL LOW (ref 101–111)
Creatinine, Ser: 0.73 mg/dL (ref 0.61–1.24)
GFR calc Af Amer: >60 mL/min (ref >60)
GFR calc non Af Amer: >60 mL/min (ref >60)
GLUCOSE: 163 mg/dL — AB (ref 65–99)
Potassium: 4.9 mmol/L (ref 3.5–5.1)
SODIUM: 132 mmol/L — AB (ref 135–145)
TOTAL PROTEIN: 7.1 g/dL (ref 6.5–8.1)

## 2015-08-14 LAB — CBC
HEMATOCRIT: 43.9 % (ref 39.0–52.0)
HEMOGLOBIN: 14.2 g/dL (ref 13.0–17.0)
MCH: 28.6 pg (ref 26.0–34.0)
MCHC: 32.3 g/dL (ref 30.0–36.0)
MCV: 88.5 fL (ref 78.0–100.0)
Platelets: 254 10*3/uL (ref 150–400)
RBC: 4.96 MIL/uL (ref 4.22–5.81)
RDW: 12.5 % (ref 11.5–15.5)
WBC: 7.9 10*3/uL (ref 4.0–10.5)

## 2015-08-14 LAB — PHOSPHORUS: Phosphorus: 4.2 mg/dL (ref 2.5–4.6)

## 2015-08-14 LAB — MAGNESIUM: Magnesium: 1.8 mg/dL (ref 1.7–2.4)

## 2015-08-17 ENCOUNTER — Other Ambulatory Visit (HOSPITAL_COMMUNITY): Payer: Self-pay

## 2015-08-17 LAB — BASIC METABOLIC PANEL
ANION GAP: 8 (ref 5–15)
BUN: 26 mg/dL — ABNORMAL HIGH (ref 6–20)
CALCIUM: 9.2 mg/dL (ref 8.9–10.3)
CO2: 28 mmol/L (ref 22–32)
CREATININE: 0.81 mg/dL (ref 0.61–1.24)
Chloride: 94 mmol/L — ABNORMAL LOW (ref 101–111)
Glucose, Bld: 222 mg/dL — ABNORMAL HIGH (ref 65–99)
Potassium: 5.6 mmol/L — ABNORMAL HIGH (ref 3.5–5.1)
Sodium: 130 mmol/L — ABNORMAL LOW (ref 135–145)

## 2015-08-17 LAB — CBC WITH DIFFERENTIAL/PLATELET
BASOS ABS: 0 10*3/uL (ref 0.0–0.1)
BASOS PCT: 0 % (ref 0–1)
EOS ABS: 0.8 10*3/uL — AB (ref 0.0–0.7)
EOS PCT: 10 % — AB (ref 0–5)
HCT: 46.2 % (ref 39.0–52.0)
Hemoglobin: 15.3 g/dL (ref 13.0–17.0)
Lymphocytes Relative: 27 % (ref 12–46)
Lymphs Abs: 2.1 10*3/uL (ref 0.7–4.0)
MCH: 28.6 pg (ref 26.0–34.0)
MCHC: 33.1 g/dL (ref 30.0–36.0)
MCV: 86.4 fL (ref 78.0–100.0)
MONO ABS: 1.1 10*3/uL — AB (ref 0.1–1.0)
MONOS PCT: 15 % — AB (ref 3–12)
NEUTROS ABS: 3.6 10*3/uL (ref 1.7–7.7)
Neutrophils Relative %: 48 % (ref 43–77)
PLATELETS: 281 10*3/uL (ref 150–400)
RBC: 5.35 MIL/uL (ref 4.22–5.81)
RDW: 12.7 % (ref 11.5–15.5)
WBC: 7.6 10*3/uL (ref 4.0–10.5)

## 2015-08-18 LAB — BASIC METABOLIC PANEL
Anion gap: 8 (ref 5–15)
BUN: 26 mg/dL — AB (ref 6–20)
CALCIUM: 9.4 mg/dL (ref 8.9–10.3)
CO2: 31 mmol/L (ref 22–32)
CREATININE: 0.79 mg/dL (ref 0.61–1.24)
Chloride: 95 mmol/L — ABNORMAL LOW (ref 101–111)
Glucose, Bld: 204 mg/dL — ABNORMAL HIGH (ref 65–99)
Potassium: 4.5 mmol/L (ref 3.5–5.1)
SODIUM: 134 mmol/L — AB (ref 135–145)

## 2015-08-20 LAB — BASIC METABOLIC PANEL
Anion gap: 8 (ref 5–15)
BUN: 23 mg/dL — AB (ref 6–20)
CALCIUM: 9.1 mg/dL (ref 8.9–10.3)
CO2: 29 mmol/L (ref 22–32)
CREATININE: 0.73 mg/dL (ref 0.61–1.24)
Chloride: 92 mmol/L — ABNORMAL LOW (ref 101–111)
GFR calc non Af Amer: >60 mL/min (ref >60)
Glucose, Bld: 231 mg/dL — ABNORMAL HIGH (ref 65–99)
Potassium: 4.8 mmol/L (ref 3.5–5.1)
SODIUM: 129 mmol/L — AB (ref 135–145)

## 2015-08-20 LAB — CBC WITH DIFFERENTIAL/PLATELET
BASOS PCT: 1 %
Basophils Absolute: 0 10*3/uL (ref 0.0–0.1)
EOS ABS: 0.8 10*3/uL — AB (ref 0.0–0.7)
EOS PCT: 10 %
HCT: 44.8 % (ref 39.0–52.0)
Hemoglobin: 14.7 g/dL (ref 13.0–17.0)
LYMPHS ABS: 2.2 10*3/uL (ref 0.7–4.0)
Lymphocytes Relative: 26 %
MCH: 28.6 pg (ref 26.0–34.0)
MCHC: 32.8 g/dL (ref 30.0–36.0)
MCV: 87.2 fL (ref 78.0–100.0)
MONO ABS: 1 10*3/uL (ref 0.1–1.0)
MONOS PCT: 12 %
Neutro Abs: 4.4 10*3/uL (ref 1.7–7.7)
Neutrophils Relative %: 52 %
Platelets: 270 10*3/uL (ref 150–400)
RBC: 5.14 MIL/uL (ref 4.22–5.81)
RDW: 12.7 % (ref 11.5–15.5)
WBC: 8.3 10*3/uL (ref 4.0–10.5)

## 2015-08-21 LAB — BASIC METABOLIC PANEL
ANION GAP: 9 (ref 5–15)
BUN: 22 mg/dL — ABNORMAL HIGH (ref 6–20)
CO2: 28 mmol/L (ref 22–32)
Calcium: 9.1 mg/dL (ref 8.9–10.3)
Chloride: 92 mmol/L — ABNORMAL LOW (ref 101–111)
Creatinine, Ser: 0.65 mg/dL (ref 0.61–1.24)
GFR calc Af Amer: >60 mL/min (ref >60)
Glucose, Bld: 201 mg/dL — ABNORMAL HIGH (ref 65–99)
POTASSIUM: 5.6 mmol/L — AB (ref 3.5–5.1)
SODIUM: 129 mmol/L — AB (ref 135–145)

## 2015-08-22 LAB — POTASSIUM: Potassium: 4.6 mmol/L (ref 3.5–5.1)

## 2015-08-24 LAB — CBC
HCT: 44.9 % (ref 39.0–52.0)
Hemoglobin: 15.7 g/dL (ref 13.0–17.0)
MCH: 29.2 pg (ref 26.0–34.0)
MCHC: 35 g/dL (ref 30.0–36.0)
MCV: 83.6 fL (ref 78.0–100.0)
PLATELETS: 215 10*3/uL (ref 150–400)
RBC: 5.37 MIL/uL (ref 4.22–5.81)
RDW: 12.6 % (ref 11.5–15.5)
WBC: 7.2 10*3/uL (ref 4.0–10.5)

## 2015-08-24 LAB — BASIC METABOLIC PANEL
ANION GAP: 9 (ref 5–15)
BUN: 20 mg/dL (ref 6–20)
CO2: 25 mmol/L (ref 22–32)
Calcium: 9.3 mg/dL (ref 8.9–10.3)
Chloride: 96 mmol/L — ABNORMAL LOW (ref 101–111)
Creatinine, Ser: 0.61 mg/dL (ref 0.61–1.24)
GFR calc Af Amer: >60 mL/min (ref >60)
Glucose, Bld: 224 mg/dL — ABNORMAL HIGH (ref 65–99)
POTASSIUM: 5 mmol/L (ref 3.5–5.1)
SODIUM: 130 mmol/L — AB (ref 135–145)

## 2015-08-28 LAB — BASIC METABOLIC PANEL
Anion gap: 8 (ref 5–15)
BUN: 21 mg/dL — AB (ref 6–20)
CALCIUM: 9.1 mg/dL (ref 8.9–10.3)
CO2: 30 mmol/L (ref 22–32)
Chloride: 92 mmol/L — ABNORMAL LOW (ref 101–111)
Creatinine, Ser: 0.9 mg/dL (ref 0.61–1.24)
GFR calc Af Amer: >60 mL/min (ref >60)
Glucose, Bld: 214 mg/dL — ABNORMAL HIGH (ref 65–99)
POTASSIUM: 4.9 mmol/L (ref 3.5–5.1)
SODIUM: 130 mmol/L — AB (ref 135–145)

## 2015-08-28 LAB — CBC WITH DIFFERENTIAL/PLATELET
BASOS ABS: 0.1 10*3/uL (ref 0.0–0.1)
Basophils Relative: 1 %
EOS ABS: 0.4 10*3/uL (ref 0.0–0.7)
EOS PCT: 5 %
HCT: 45 % (ref 39.0–52.0)
Hemoglobin: 14.7 g/dL (ref 13.0–17.0)
LYMPHS ABS: 2.4 10*3/uL (ref 0.7–4.0)
Lymphocytes Relative: 28 %
MCH: 28.1 pg (ref 26.0–34.0)
MCHC: 32.7 g/dL (ref 30.0–36.0)
MCV: 85.9 fL (ref 78.0–100.0)
Monocytes Absolute: 1.2 10*3/uL — ABNORMAL HIGH (ref 0.1–1.0)
Monocytes Relative: 14 %
Neutro Abs: 4.4 10*3/uL (ref 1.7–7.7)
Neutrophils Relative %: 52 %
PLATELETS: 232 10*3/uL (ref 150–400)
RBC: 5.24 MIL/uL (ref 4.22–5.81)
RDW: 12.7 % (ref 11.5–15.5)
WBC: 8.5 10*3/uL (ref 4.0–10.5)

## 2015-08-28 LAB — PHOSPHORUS: Phosphorus: 4.3 mg/dL (ref 2.5–4.6)

## 2015-08-28 LAB — MAGNESIUM: MAGNESIUM: 1.7 mg/dL (ref 1.7–2.4)

## 2015-09-02 ENCOUNTER — Other Ambulatory Visit (HOSPITAL_COMMUNITY): Payer: Medicare PPO

## 2015-10-01 ENCOUNTER — Ambulatory Visit: Payer: Self-pay | Admitting: *Deleted

## 2015-10-01 DIAGNOSIS — Z7901 Long term (current) use of anticoagulants: Secondary | ICD-10-CM

## 2015-10-01 DIAGNOSIS — I4891 Unspecified atrial fibrillation: Secondary | ICD-10-CM

## 2015-12-04 ENCOUNTER — Other Ambulatory Visit: Payer: Self-pay

## 2015-12-04 NOTE — Patient Outreach (Signed)
[facility]) Care Management  12/04/2015  Edgar BloodgoodJoe Henry [name] 1946-06-28 [id number]   Referral Date: 11-26-15 Referral Reason:  [facility].    Outreach #1 Called [phone] Disconnected Called [phone]-Number to [facility] Called [phone] Number Disconnected Called Daughter Lanice SchwabMisty Didson @  [phone] HIPAA Compliant voice mail.    Plan: RN Health Coach will attempt patient within 1-2 weeks.    Bary Lericheionne J Fynlee Rowlands, RN, MSN [facility] Care [facility] RN Telephonic Health Coach [phone]

## 2015-12-10 ENCOUNTER — Other Ambulatory Visit: Payer: Self-pay

## 2015-12-10 NOTE — Patient Outreach (Signed)
[facility]) Care Management  12/10/2015  Edgar BloodgoodJoe Henry [name] [birth year] [id number]   [facility] @ [phone] to verify patient information. Spoke with Sadie she reports that patient has not been seen in their office. Verified with her contact information.    Telephone call to only working number [phone].  HIPAA compliant voice message left.    Plan: RN Health Coach will attempt outreach within 1-2 weeks.    Edgar Lericheionne J Raijon Lindfors, RN, MSN [facility] Care Management RN Telephonic Health Coach [phone]

## 2015-12-15 ENCOUNTER — Other Ambulatory Visit: Payer: Self-pay

## 2015-12-15 NOTE — Patient Outreach (Signed)
[facility]) Care Management  12/15/2015  Edgar BloodgoodJoe Henry Welle May 31, 1946 [id number]   3rd telephone call to patient's daughter Laurence SpatesMisty Didson.  No answer.  HIPAA compliant voice message left.  Plan: RN Health Coach will send outreach letter.  If no response within 10 business days will close case.  Bary Lericheionne J Silvana Holecek, RN, MSN [facility] Care Management RN Telephonic Health Coach [phone]

## 2015-12-31 ENCOUNTER — Other Ambulatory Visit: Payer: Self-pay

## 2015-12-31 NOTE — Patient Outreach (Signed)
[facility]) Care Management  12/31/2015  [name] 15-Aug-1946 [id number]   No response from patient after 3 outreach calls and letter.  Plan: RN Health Coach will forward patient information to Nena Polio for case closure.   RN Health Coach will send physician closure letter.   Bary Leriche, RN, MSN [facility] Care Management RN Telephonic Health Coach [phone]

## 2016-03-05 DEATH — deceased

## 2017-01-30 IMAGING — CR DG ABD PORTABLE 1V
2 series · 2 of 2 positions shown · non-contrast
Comparison: None.

CLINICAL DATA: Percutaneous gastrostomy tube check

EXAM:
PORTABLE ABDOMEN - 1 VIEW

[AP (1 of 2)]
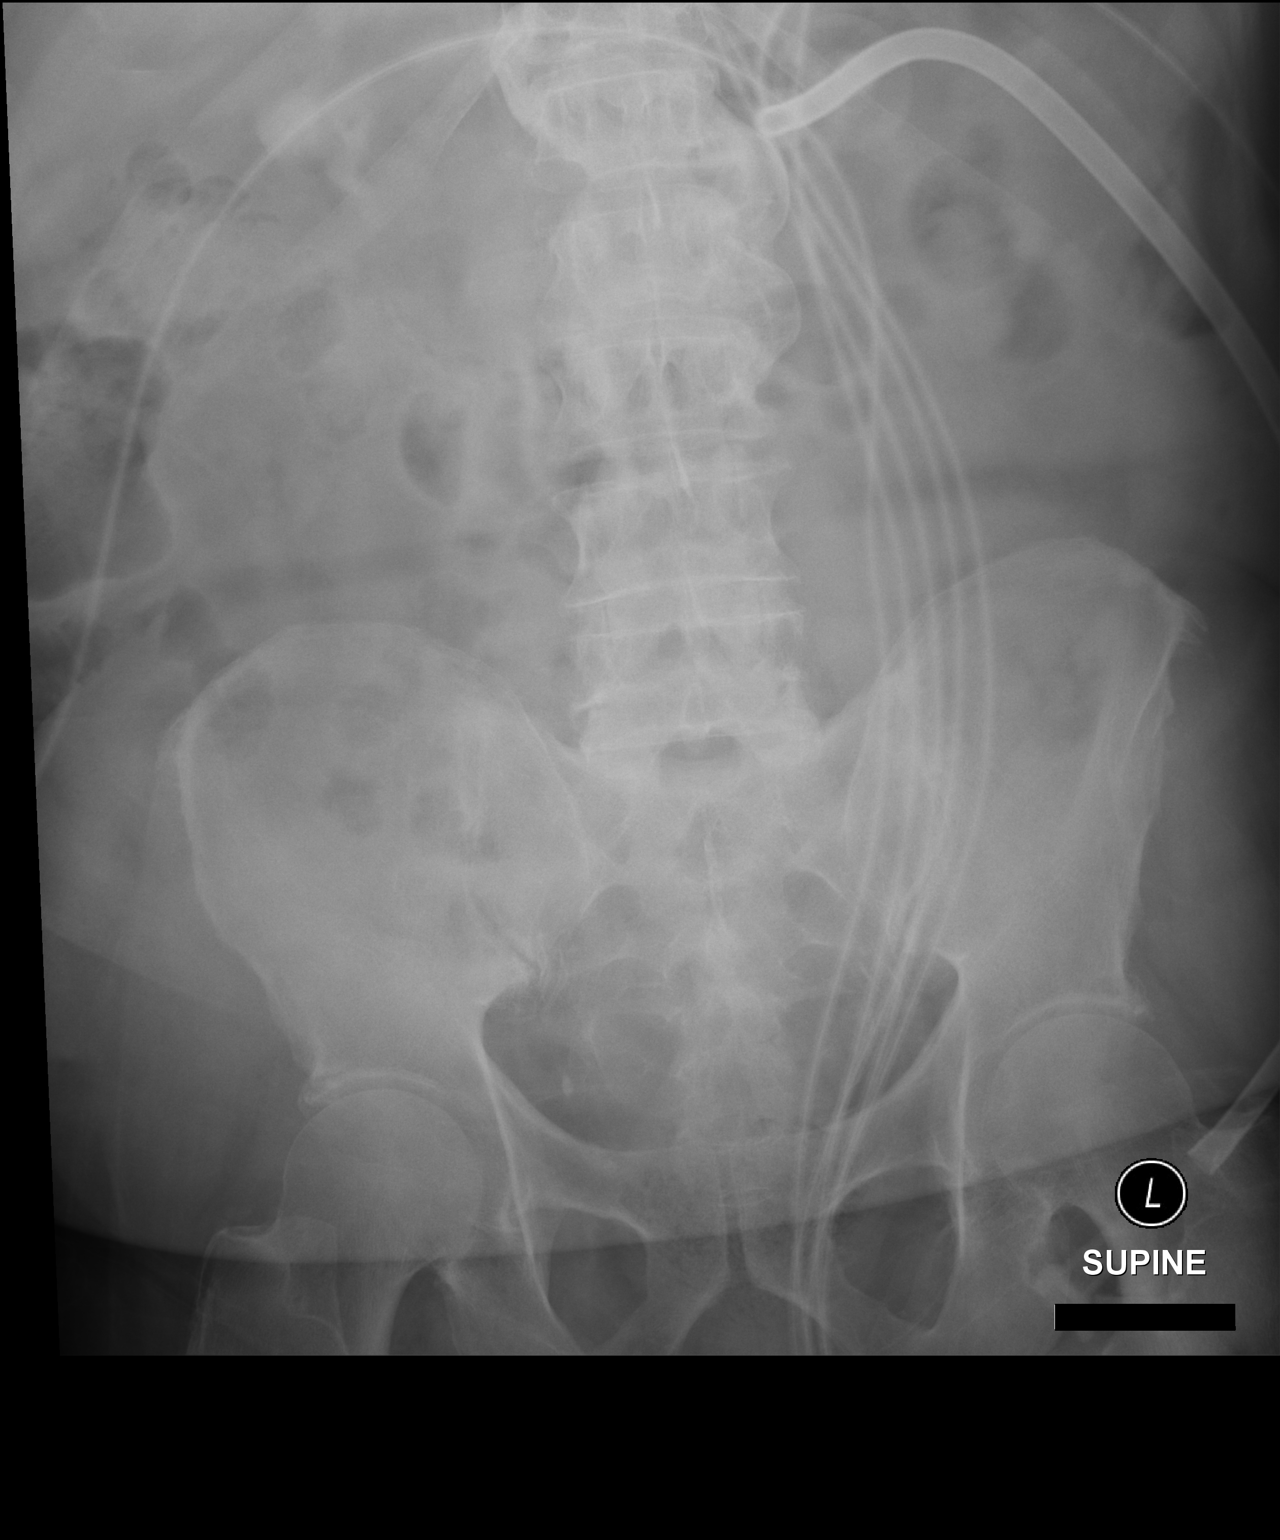

[AP (2 of 2)]
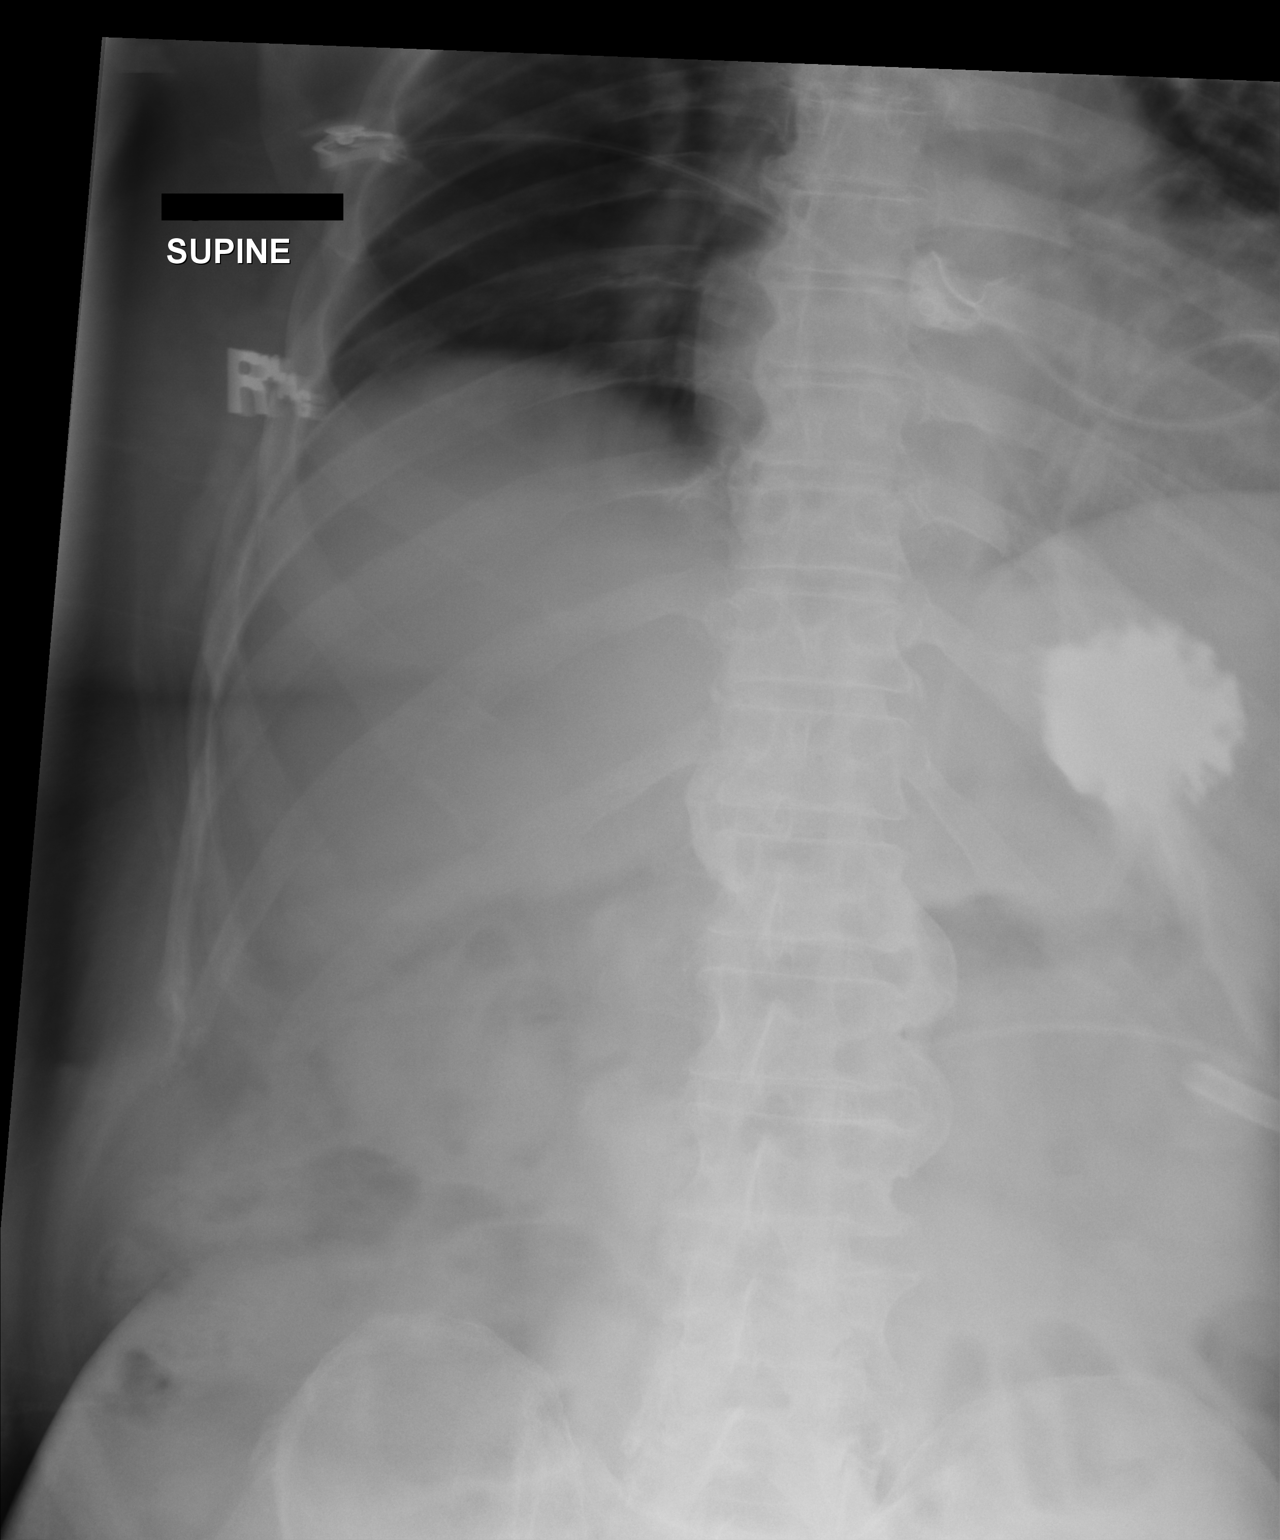

[2 of 2 positions shown; findings below may reference images not displayed]

FINDINGS: Contrast injected through PEG tube projects within the lumen of the
stomach.
IMPRESSION: Contrast within the stomach implies correct positioning of PEG tube.

## 2017-01-30 IMAGING — CR DG ABD PORTABLE 1V
1 series · 1 of 1 positions shown · non-contrast
Comparison: None.

CLINICAL DATA: Feeding tube.

EXAM:
PORTABLE ABDOMEN - 1 VIEW

[AP]
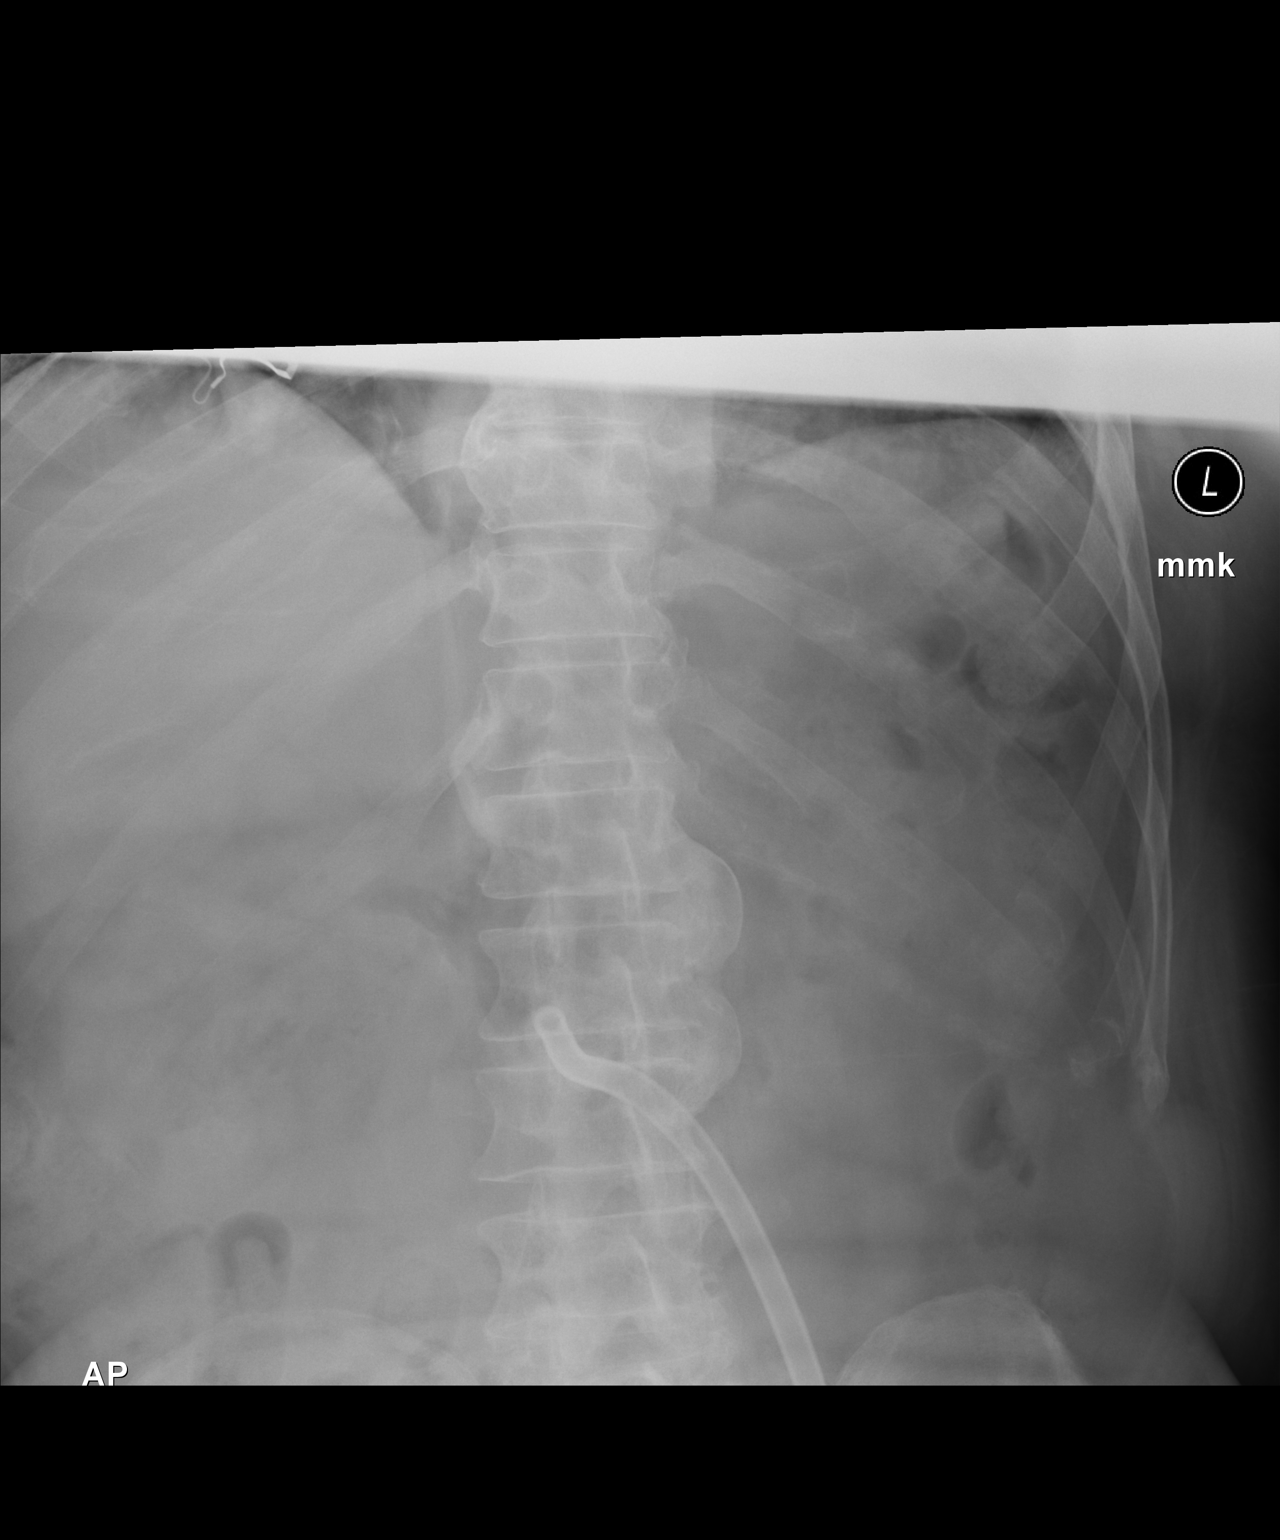

[1 of 1 positions shown; findings below may reference images not displayed]

FINDINGS: A feeding tube is identified at the level of the L2 vertebral body.
Its position cannot be determined from this image. The bowel gas
pattern is unremarkable.
IMPRESSION: A feeding tube is identified but its position cannot be determined
from this image. Injection of contrast material and a single view of
the abdomen could be used to determine position.

## 2017-01-31 IMAGING — CR DG CHEST 1V PORT
1 series · 1 of 1 positions shown · non-contrast
Comparison: Portable chest x-ray June 27, 2015

CLINICAL DATA: Respiratory failure, coronary artery disease, atrial
fibrillation, mitral regurgitation.

EXAM:
PORTABLE CHEST - 1 VIEW

[AP]
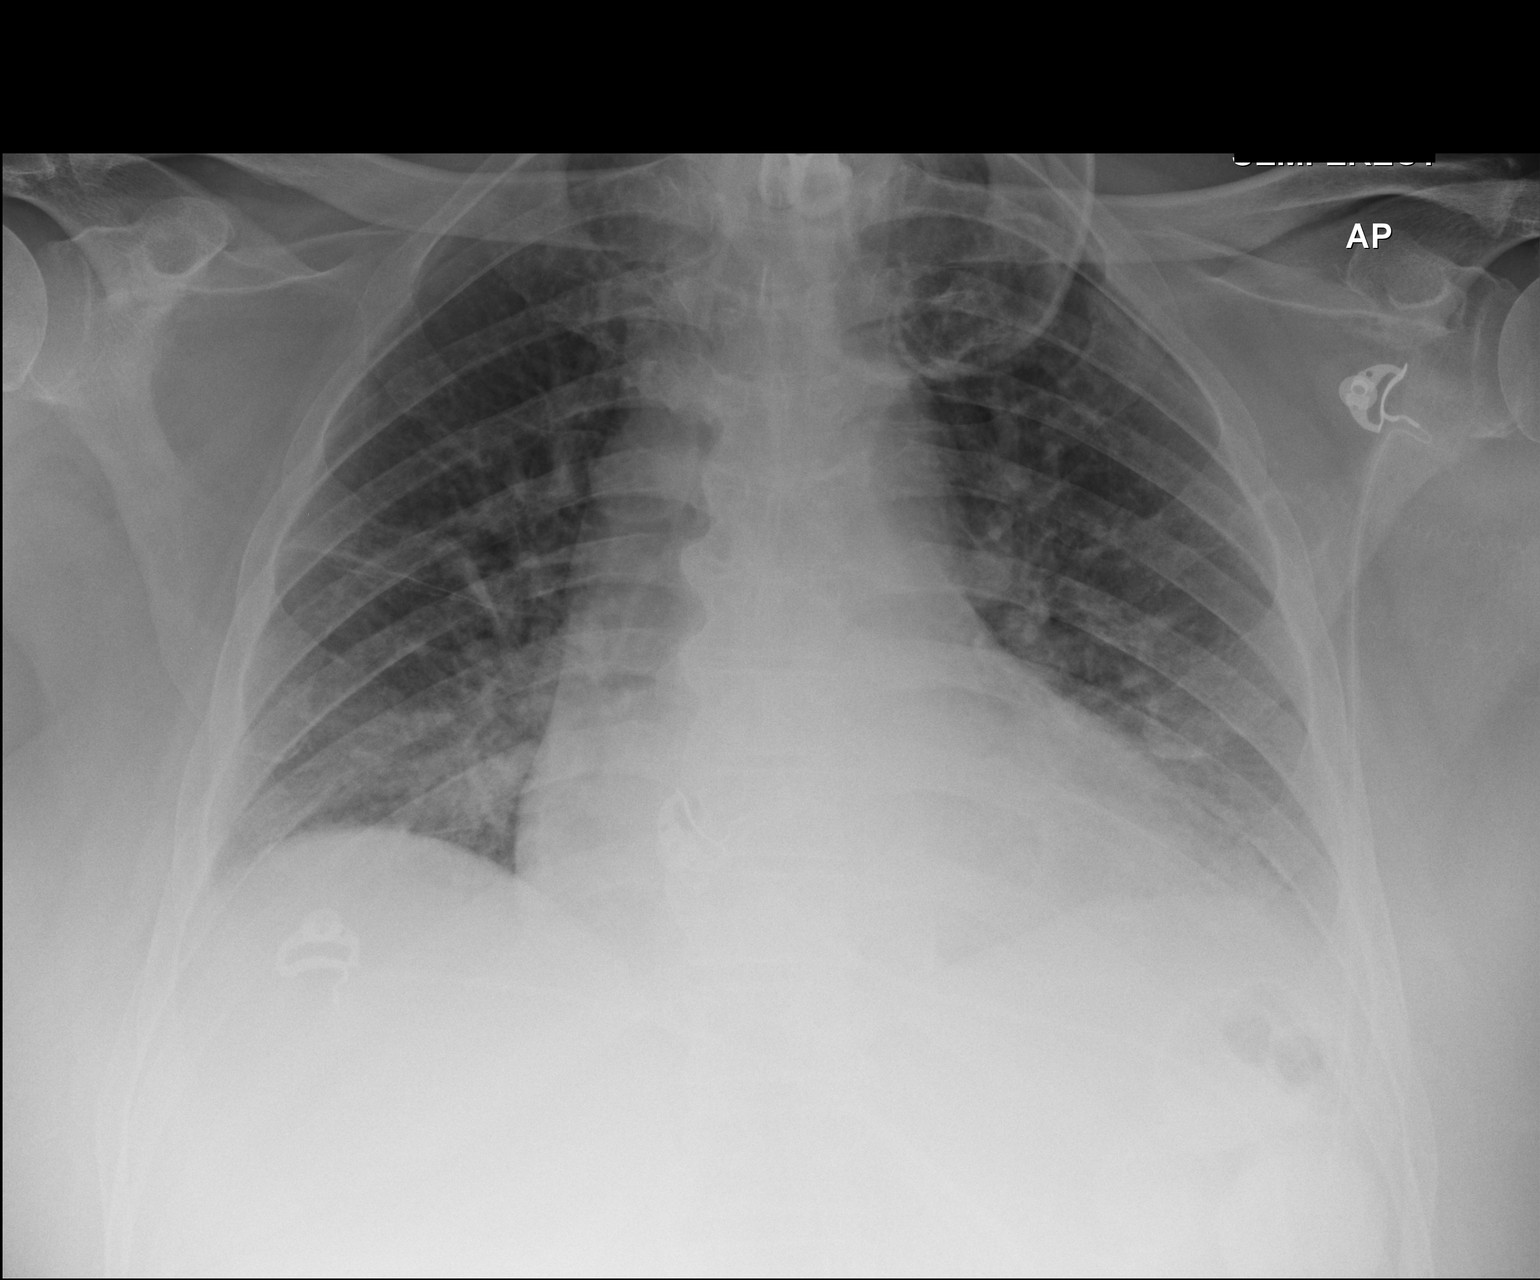

[1 of 1 positions shown; findings below may reference images not displayed]

FINDINGS: The lungs are adequately inflated. The interstitial markings are
prominent today but remain increased in the mid and lower lung
zones. The cardiac silhouette is enlarged. The central pulmonary
vascularity is mildly prominent. The tracheostomy appliance tip lies
at the level of the clavicular heads. There is no pleural effusion.
IMPRESSION: Low-grade pulmonary interstitial edema secondary to CHF. There is no
alveolar pneumonia nor significant pleural effusion.

## 2017-02-04 IMAGING — CR DG CHEST 1V PORT
1 series · 1 of 1 positions shown · non-contrast
Comparison: Four days ago

CLINICAL DATA: Respiratory failure.  Tracheoplasty.

EXAM:
PORTABLE CHEST - 1 VIEW

[AP]
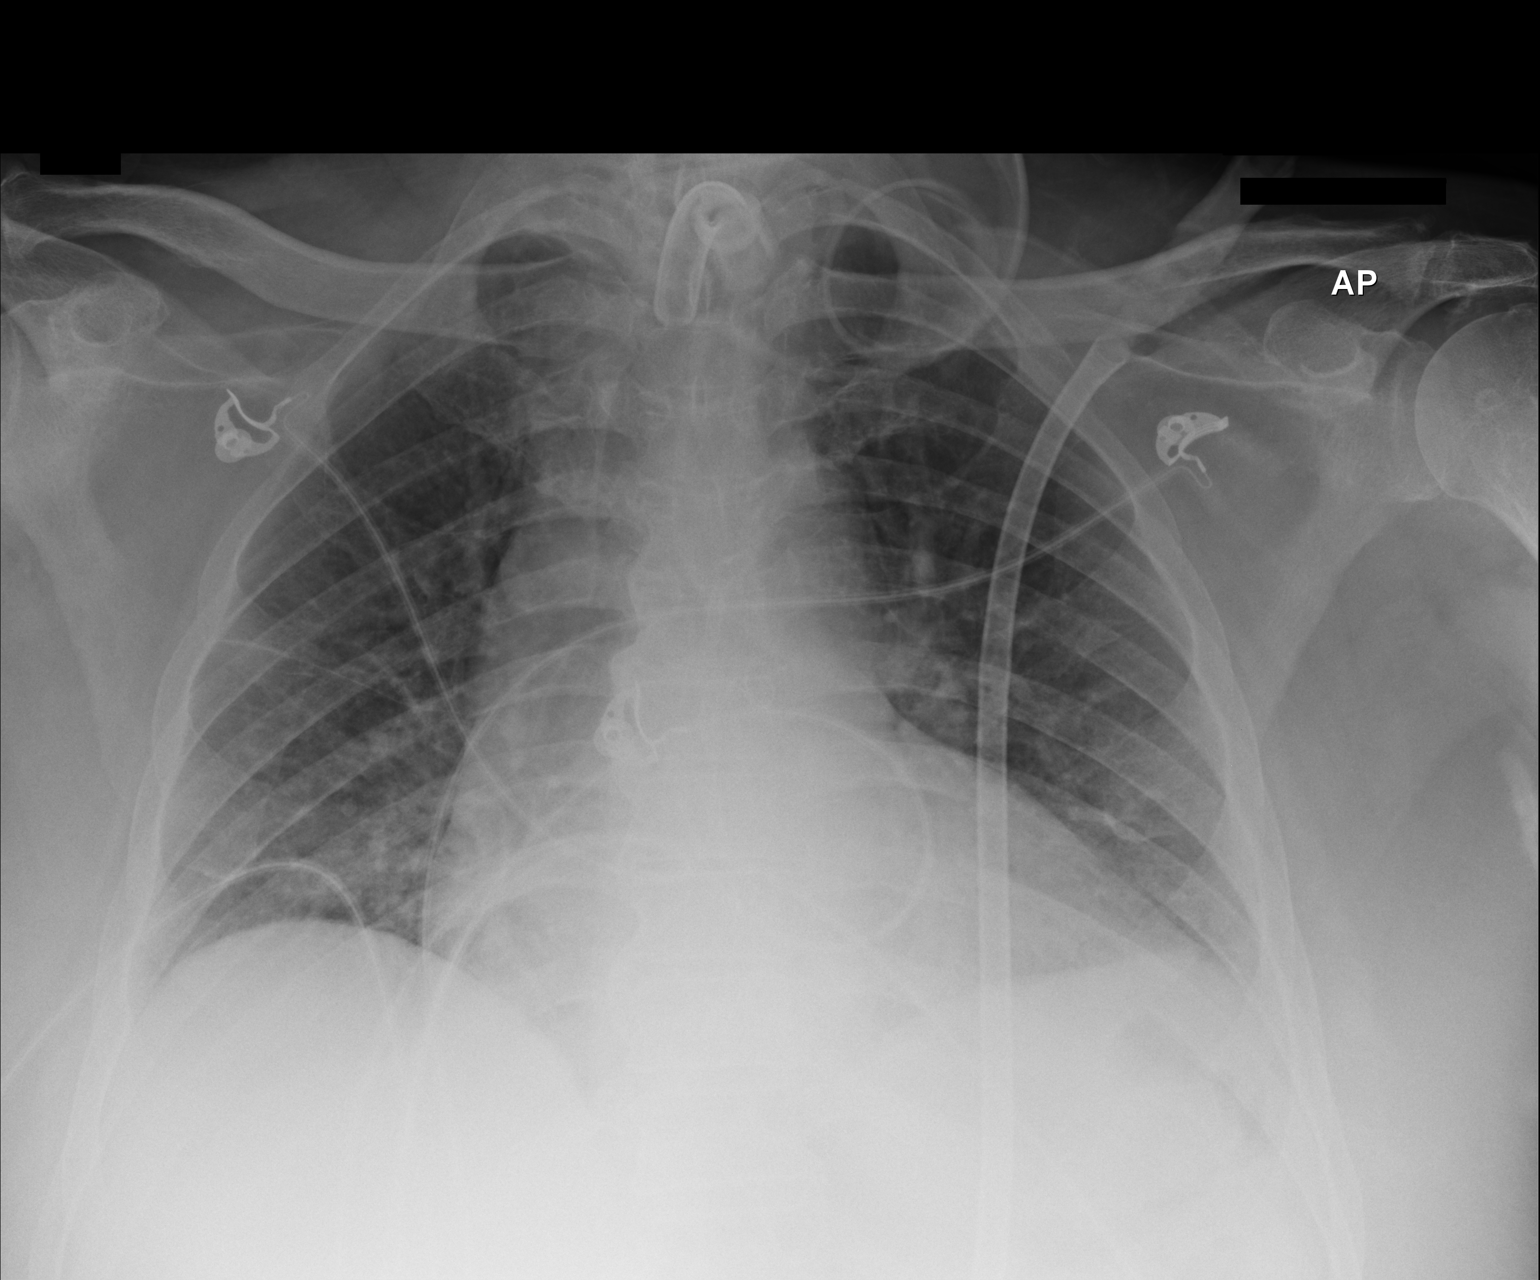

[1 of 1 positions shown; findings below may reference images not displayed]

FINDINGS: Chronic cardiomegaly and vascular pedicle widening.

Pulmonary venous congestion without overt edema. Lung volumes remain
low. No definitive effusion.

Tracheostomy tube remains well seated.
IMPRESSION: Stable cardiomegaly and pulmonary venous congestion.
